# Patient Record
Sex: Female | Born: 1975
Health system: Southern US, Community
[De-identification: ages and names within clinical notes are randomized; demographics above are authoritative.]

## PROBLEM LIST (undated history)

## (undated) DIAGNOSIS — K219 Gastro-esophageal reflux disease without esophagitis: Secondary | ICD-10-CM

## (undated) DIAGNOSIS — N912 Amenorrhea, unspecified: Secondary | ICD-10-CM

## (undated) DIAGNOSIS — K8020 Calculus of gallbladder without cholecystitis without obstruction: Secondary | ICD-10-CM

## (undated) DIAGNOSIS — G43909 Migraine, unspecified, not intractable, without status migrainosus: Secondary | ICD-10-CM

## (undated) DIAGNOSIS — N83209 Unspecified ovarian cyst, unspecified side: Secondary | ICD-10-CM

## (undated) HISTORY — DX: Gastro-esophageal reflux disease without esophagitis: K21.9

## (undated) HISTORY — DX: Calculus of gallbladder without cholecystitis without obstruction: K80.20

## (undated) HISTORY — DX: Migraine, unspecified, not intractable, without status migrainosus: G43.909

## (undated) HISTORY — DX: Unspecified ovarian cyst, unspecified side: N83.209

## (undated) HISTORY — DX: Amenorrhea, unspecified: N91.2

---

## 2008-03-28 ENCOUNTER — Ambulatory Visit: Payer: Self-pay

## 2008-07-12 ENCOUNTER — Encounter: Payer: Self-pay | Admitting: General Practice

## 2008-07-27 ENCOUNTER — Encounter: Payer: Self-pay | Admitting: General Practice

## 2008-08-27 ENCOUNTER — Encounter: Payer: Self-pay | Admitting: General Practice

## 2010-11-30 ENCOUNTER — Emergency Department: Payer: Self-pay | Admitting: Emergency Medicine

## 2011-08-13 ENCOUNTER — Emergency Department: Payer: Self-pay | Admitting: Emergency Medicine

## 2011-08-13 ENCOUNTER — Ambulatory Visit: Payer: Self-pay | Admitting: Family Medicine

## 2011-08-13 LAB — URINALYSIS, COMPLETE
Bilirubin,UR: NEGATIVE
Glucose,UR: 150 mg/dL (ref 0–75)
Leukocyte Esterase: NEGATIVE
Nitrite: NEGATIVE
Ph: 6 (ref 4.5–8.0)
Squamous Epithelial: 2

## 2011-08-13 LAB — COMPREHENSIVE METABOLIC PANEL
Albumin: 3.1 g/dL — ABNORMAL LOW (ref 3.4–5.0)
Alkaline Phosphatase: 176 U/L — ABNORMAL HIGH (ref 50–136)
Anion Gap: 11 (ref 7–16)
BUN: 6 mg/dL — ABNORMAL LOW (ref 7–18)
Bilirubin,Total: 0.4 mg/dL (ref 0.2–1.0)
Calcium, Total: 8.9 mg/dL (ref 8.5–10.1)
Chloride: 103 mmol/L (ref 98–107)
Co2: 21 mmol/L (ref 21–32)
EGFR (African American): >60
Osmolality: 271 (ref 275–301)
Potassium: 3.6 mmol/L (ref 3.5–5.1)
SGOT(AST): 54 U/L — ABNORMAL HIGH (ref 15–37)
SGPT (ALT): 75 U/L
Total Protein: 7 g/dL (ref 6.4–8.2)

## 2011-08-13 LAB — LIPASE, BLOOD: Lipase: 152 U/L (ref 73–393)

## 2011-08-13 LAB — CBC
MCV: 84 fL (ref 80–100)
Platelet: 174 10*3/uL (ref 150–440)
RDW: 13.8 % (ref 11.5–14.5)

## 2011-10-09 ENCOUNTER — Observation Stay: Payer: Self-pay

## 2011-10-16 ENCOUNTER — Observation Stay: Payer: Self-pay

## 2011-10-19 ENCOUNTER — Observation Stay: Payer: Self-pay

## 2011-10-23 ENCOUNTER — Inpatient Hospital Stay: Payer: Self-pay | Admitting: Advanced Practice Midwife

## 2011-10-23 LAB — PROTEIN / CREATININE RATIO, URINE
Protein, Random Urine: 28 mg/dL — ABNORMAL HIGH (ref 0–12)
Protein/Creat. Ratio: 613 mg/gCREAT — ABNORMAL HIGH (ref 0–200)

## 2011-10-23 LAB — CBC WITH DIFFERENTIAL/PLATELET
Basophil #: 0.1 10*3/uL (ref 0.0–0.1)
Basophil %: 0.6 %
Eosinophil #: 0 10*3/uL (ref 0.0–0.7)
HCT: 32.8 % — ABNORMAL LOW (ref 35.0–47.0)
Lymphocyte %: 13.2 %
MCHC: 34.7 g/dL (ref 32.0–36.0)
Monocyte #: 0.8 x10 3/mm (ref 0.2–0.9)
Monocyte %: 6.4 %
Neutrophil %: 79.4 %
Platelet: 167 10*3/uL (ref 150–440)
RBC: 4.01 10*6/uL (ref 3.80–5.20)
RDW: 14.7 % — ABNORMAL HIGH (ref 11.5–14.5)

## 2011-10-24 LAB — PLATELET COUNT: Platelet: 171 10*3/uL (ref 150–440)

## 2011-10-25 LAB — HEMATOCRIT: HCT: 31.3 % — ABNORMAL LOW (ref 35.0–47.0)

## 2011-10-29 LAB — PATHOLOGY REPORT

## 2012-01-28 HISTORY — PX: CHOLECYSTECTOMY: SHX55

## 2012-08-09 ENCOUNTER — Other Ambulatory Visit: Payer: Self-pay | Admitting: Surgery

## 2012-08-09 LAB — COMPREHENSIVE METABOLIC PANEL
Albumin: 3.7 g/dL (ref 3.4–5.0)
Alkaline Phosphatase: 83 U/L (ref 50–136)
Anion Gap: 2 — ABNORMAL LOW (ref 7–16)
Chloride: 105 mmol/L (ref 98–107)
Co2: 31 mmol/L (ref 21–32)
Creatinine: 0.76 mg/dL (ref 0.60–1.30)
EGFR (African American): >60
Glucose: 89 mg/dL (ref 65–99)
Osmolality: 275 (ref 275–301)
Potassium: 3.9 mmol/L (ref 3.5–5.1)
SGPT (ALT): 27 U/L (ref 12–78)
Total Protein: 7.4 g/dL (ref 6.4–8.2)

## 2012-08-09 LAB — CBC WITH DIFFERENTIAL/PLATELET
Basophil %: 0.5 %
Eosinophil #: 0.1 10*3/uL (ref 0.0–0.7)
Lymphocyte %: 24.1 %
MCHC: 33.6 g/dL (ref 32.0–36.0)
MCV: 81 fL (ref 80–100)
Monocyte #: 0.4 x10 3/mm (ref 0.2–0.9)
Monocyte %: 7.2 %
Neutrophil #: 4 10*3/uL (ref 1.4–6.5)
Neutrophil %: 67 %
Platelet: 211 10*3/uL (ref 150–440)
RBC: 4.55 10*6/uL (ref 3.80–5.20)
RDW: 14.4 % (ref 11.5–14.5)
WBC: 6 10*3/uL (ref 3.6–11.0)

## 2012-08-10 ENCOUNTER — Ambulatory Visit: Payer: Self-pay | Admitting: Surgery

## 2012-10-12 ENCOUNTER — Ambulatory Visit: Payer: Self-pay | Admitting: Surgery

## 2012-10-12 ENCOUNTER — Observation Stay: Payer: Self-pay | Admitting: Surgery

## 2012-10-13 LAB — CBC WITH DIFFERENTIAL/PLATELET
Basophil #: 0.1 10*3/uL (ref 0.0–0.1)
Eosinophil %: 0.1 %
HCT: 37.1 % (ref 35.0–47.0)
HGB: 12.5 g/dL (ref 12.0–16.0)
Lymphocyte #: 1.3 10*3/uL (ref 1.0–3.6)
MCHC: 33.5 g/dL (ref 32.0–36.0)
MCV: 81 fL (ref 80–100)
Neutrophil #: 9.2 10*3/uL — ABNORMAL HIGH (ref 1.4–6.5)
Neutrophil %: 81.3 %
RBC: 4.6 10*6/uL (ref 3.80–5.20)

## 2012-10-13 LAB — LIPASE, BLOOD: Lipase: 49 U/L — ABNORMAL LOW (ref 73–393)

## 2012-10-13 LAB — BASIC METABOLIC PANEL
Anion Gap: 4 — ABNORMAL LOW (ref 7–16)
BUN: 8 mg/dL (ref 7–18)
Chloride: 106 mmol/L (ref 98–107)
Co2: 27 mmol/L (ref 21–32)
Creatinine: 0.77 mg/dL (ref 0.60–1.30)
EGFR (African American): >60
EGFR (Non-African Amer.): >60
Glucose: 102 mg/dL — ABNORMAL HIGH (ref 65–99)
Osmolality: 272 (ref 275–301)
Potassium: 4.3 mmol/L (ref 3.5–5.1)
Sodium: 137 mmol/L (ref 136–145)

## 2012-10-13 LAB — HEPATIC FUNCTION PANEL A (ARMC)
Albumin: 3.6 g/dL (ref 3.4–5.0)
Alkaline Phosphatase: 72 U/L (ref 50–136)
Bilirubin, Direct: <0.1 mg/dL (ref 0.00–0.20)
SGOT(AST): 37 U/L (ref 15–37)

## 2014-05-03 ENCOUNTER — Encounter: Payer: Self-pay | Admitting: *Deleted

## 2014-05-19 NOTE — Discharge Summary (Signed)
PATIENT NAME:  Valerie Lindsey, Valerie Lindsey MR#:  [id number] DATE OF BIRTH:  [birth year]  DATE OF ADMISSION:  10/12/2012 DATE OF DISCHARGE:  10/13/2012  BRIEF HISTORY: Afua Hoots is a 39 year old woman readmitted following a laparoscopic cholecystectomy for nausea and vomiting. She had persistent problems with nausea and vomiting postsurgery to the point that she was unable to keep anything on her stomach. She presented back to the Emergency Room for further evaluation. Exam was unremarkable. She was readmitted to the hospital for her persistent vomiting. She was rehydrated, given IV Zofran and her symptoms resolved over the first 8 to 12 hours. She has been 12 hours without vomiting, able to tolerate a liquid diet and has no more abdominal distress. She does have some mild pain which is well controlled with Norco.   DISCHARGE MEDICATIONS: Include Nexium 40 mg p.o. daily, Norco 325/5 mg once a day and Zofran 4 mg dissolving tablets q.8 hours p.r.n.   FINAL DISCHARGE DIAGNOSIS: Postoperative nausea and vomiting.    ____________________________ Micheline Maze, MD rle:gb D: 10/13/2012 19:34:10 ET T: 10/14/2012 01:45:51 ET JOB#: [id number]  cc: Rodena Goldmann III, MD, <Dictator> Rodena Goldmann MD ELECTRONICALLY SIGNED 10/18/2012 20:04

## 2014-05-19 NOTE — Op Note (Signed)
PATIENT NAME:  Valerie Lindsey, Valerie Lindsey MR#:  [id number] DATE OF BIRTH:  05-13-1975  DATE OF PROCEDURE:  10/12/2012  PREOPERATIVE DIAGNOSIS: Cholecystis and cholelithiasis.   POSTOPERATIVE DIAGNOSIS: Cholecystis and cholelithiasis.   OPERATION: Laparoscopic cholecystectomy with cholangiography.   SURGEON: Rodena Goldmann III, MD  ANESTHESIA: General.   OPERATIVE PROCEDURE: With the patient in the supine position, after the induction of the appropriate general anesthesia, the patient's abdomen was prepped with ChloraPrep and draped with sterile towels. The patient was placed in the head down, feet up position. A small infraumbilical incision was made in the standard fashion, carried down bluntly through the subcutaneous tissue. The Veress needle was used to cannulate the peritoneal cavity. CO2 was insufflated to appropriate pressure measurements. When approximately 2.5 liters of CO2 was instilled, the Veress needle was withdrawn. An 11 mm [employer] port was inserted into the peritoneal cavity. Intraperitoneal position was confirmed, and CO2 was re-insufflated. The patient was placed in the head up, feet down position and rotated slightly to the left side. A subxiphoid transverse incision was made, and an 11 mm port was inserted under direct vision. Two lateral ports, 5 mm in size, were inserted under direct vision. Dissection was carried out along the hepatoduodenal ligament. The cystic artery and cystic duct were identified. The cystic duct was quite short. The common duct was easily visualized. The cystic duct was clipped on the gallbladder side and opened. intraoperative cholangiogram was attempted, but I could not get the cholangiogram catheter to go past the valves, and because the duct was so short, I did not want to make a second incision. We therefore injected contrast into the common bile duct through the cystic duct, took a single picture, which did demonstrate flow into the duodenum without evidence  of any obstruction. Intrahepatic radicles were not seen, but the common bile duct was easily visualized. The catheter was withdrawn. The cystic duct was doubly clipped on the common duct side. The cystic duct was then divided. The cystic artery was doubly clipped and divided. The gallbladder was then dissected free from its bed in the liver with hook and cautery apparatus. Once the gallbladder was free, the gallbladder was captured in an Endo Catch apparatus and removed through the subxiphoid incision. The area was then copiously irrigated. Pictures were taken. The upper incision was closed with a figure-of-eight suture of 0 Vicryl under direct vision using the suture passer apparatus. The abdomen was then desufflated. All ports were withdrawn without difficulty. The midline incision was closed with 5-0 nylon suture. The remainder of the incisions were closed with subcuticular 4-0 Monocryl. Dermabond was placed. Sterile dressings were placed after injecting 0.25% Marcaine for postoperative pain control. The patient was returned to the recovery room, having tolerated the procedure well. Sponge, instrument and needle counts were correct x2 in the operating room.   ____________________________ Rodena Goldmann III, MD rle:OSi D: 10/12/2012 08:33:58 ET T: 10/12/2012 09:14:50 ET JOB#: [id number]  cc: Rodena Goldmann III, MD, <Dictator> Rodena Goldmann MD ELECTRONICALLY SIGNED 10/14/2012 0:05

## 2015-03-15 DIAGNOSIS — G43829 Menstrual migraine, not intractable, without status migrainosus: Secondary | ICD-10-CM | POA: Diagnosis not present

## 2015-03-15 DIAGNOSIS — Z01419 Encounter for gynecological examination (general) (routine) without abnormal findings: Secondary | ICD-10-CM | POA: Diagnosis not present

## 2015-03-15 DIAGNOSIS — K219 Gastro-esophageal reflux disease without esophagitis: Secondary | ICD-10-CM | POA: Diagnosis not present

## 2015-04-18 DIAGNOSIS — H52223 Regular astigmatism, bilateral: Secondary | ICD-10-CM | POA: Diagnosis not present

## 2015-04-18 DIAGNOSIS — H5213 Myopia, bilateral: Secondary | ICD-10-CM | POA: Diagnosis not present

## 2015-06-08 ENCOUNTER — Encounter: Payer: Self-pay | Admitting: Family Medicine

## 2015-06-08 ENCOUNTER — Ambulatory Visit (INDEPENDENT_AMBULATORY_CARE_PROVIDER_SITE_OTHER): Payer: 59 | Admitting: Family Medicine

## 2015-06-08 VITALS — BP 118/64 | HR 61 | Temp 98.1°F | Ht 65.75 in | Wt 197.4 lb

## 2015-06-08 DIAGNOSIS — Z13220 Encounter for screening for lipoid disorders: Secondary | ICD-10-CM

## 2015-06-08 DIAGNOSIS — E669 Obesity, unspecified: Secondary | ICD-10-CM

## 2015-06-08 DIAGNOSIS — Z130 Encounter for screening for diseases of the blood and blood-forming organs and certain disorders involving the immune mechanism: Secondary | ICD-10-CM

## 2015-06-08 DIAGNOSIS — Z0000 Encounter for general adult medical examination without abnormal findings: Secondary | ICD-10-CM

## 2015-06-08 LAB — COMPREHENSIVE METABOLIC PANEL
ALBUMIN: 4.2 g/dL (ref 3.6–5.1)
ALK PHOS: 60 U/L (ref 33–115)
ALT: 21 U/L (ref 6–29)
AST: 18 U/L (ref 10–30)
BILIRUBIN TOTAL: 0.3 mg/dL (ref 0.2–1.2)
BUN: 13 mg/dL (ref 7–25)
CALCIUM: 9.5 mg/dL (ref 8.6–10.2)
CO2: 26 mmol/L (ref 20–31)
CREATININE: 0.7 mg/dL (ref 0.50–1.10)
Chloride: 105 mmol/L (ref 98–110)
Glucose, Bld: 89 mg/dL (ref 65–99)
Potassium: 3.8 mmol/L (ref 3.5–5.3)
Sodium: 139 mmol/L (ref 135–146)
TOTAL PROTEIN: 6.8 g/dL (ref 6.1–8.1)

## 2015-06-08 LAB — CBC
HEMATOCRIT: 36.2 % (ref 35.0–45.0)
Hemoglobin: 12.3 g/dL (ref 11.7–15.5)
MCH: 27.3 pg (ref 27.0–33.0)
MCHC: 34 g/dL (ref 32.0–36.0)
MCV: 80.3 fL (ref 80.0–100.0)
MPV: 9.3 fL (ref 7.5–12.5)
Platelets: 219 10*3/uL (ref 140–400)
RBC: 4.51 MIL/uL (ref 3.80–5.10)
RDW: 14.7 % (ref 11.0–15.0)
WBC: 7.3 10*3/uL (ref 3.8–10.8)

## 2015-06-08 LAB — LIPID PANEL
CHOLESTEROL: 181 mg/dL (ref 125–200)
HDL: 52 mg/dL (ref >=46)
LDL Cholesterol: 100 mg/dL (ref <130)
Total CHOL/HDL Ratio: 3.5 Ratio (ref <=5.0)
Triglycerides: 145 mg/dL (ref <150)
VLDL: 29 mg/dL (ref <30)

## 2015-06-08 LAB — TSH: TSH: 0.51 mIU/L

## 2015-06-08 MED ORDER — OMEPRAZOLE 40 MG PO CPDR: 40.0000 mg | DELAYED_RELEASE_CAPSULE | Freq: Every day | ORAL | Status: DC

## 2015-06-08 MED ORDER — SUMATRIPTAN 20 MG/ACT NA SOLN: 20.0000 mg | Freq: Once | NASAL | Status: DC

## 2015-06-08 NOTE — Progress Notes (Signed)
Pre visit review using our clinic review tool, if applicable. No additional management support is needed unless otherwise documented below in the visit note. 

## 2015-06-08 NOTE — Patient Instructions (Signed)
It was nice to see you today.  I have reviewed her medications.  Please be sure to get her mammogram later this year.  Follow up annually.  Take care  Dr. Adriana Simas  Health Maintenance, Female Adopting a healthy lifestyle and getting preventive care can go a long way to promote health and wellness. Talk with your health care provider about what schedule of regular examinations is right for you. This is a good chance for you to check in with your provider about disease prevention and staying healthy. In between checkups, there are plenty of things you can do on your own. Experts have done a lot of research about which lifestyle changes and preventive measures are most likely to keep you healthy. Ask your health care provider for more information. WEIGHT AND DIET  Eat a healthy diet  Be sure to include plenty of vegetables, fruits, low-fat dairy products, and lean protein.  Do not eat a lot of foods high in solid fats, added sugars, or salt.  Get regular exercise. This is one of the most important things you can do for your health.  Most adults should exercise for at least 150 minutes each week. The exercise should increase your heart rate and make you sweat (moderate-intensity exercise).  Most adults should also do strengthening exercises at least twice a week. This is in addition to the moderate-intensity exercise.  Maintain a healthy weight  Body mass index (BMI) is a measurement that can be used to identify possible weight problems. It estimates body fat based on height and weight. Your health care provider can help determine your BMI and help you achieve or maintain a healthy weight.  For females 55 years of age and older:   A BMI below 18.5 is considered underweight.  A BMI of 18.5 to 24.9 is normal.  A BMI of 25 to 29.9 is considered overweight.  A BMI of 30 and above is considered obese.  Watch levels of cholesterol and blood lipids  You should start having your blood  tested for lipids and cholesterol at 40 years of age, then have this test every 5 years.  You may need to have your cholesterol levels checked more often if:  Your lipid or cholesterol levels are high.  You are older than 40 years of age.  You are at high risk for heart disease.  CANCER SCREENING   Lung Cancer  Lung cancer screening is recommended for adults 35-24 years old who are at high risk for lung cancer because of a history of smoking.  A yearly low-dose CT scan of the lungs is recommended for people who:  Currently smoke.  Have quit within the past 15 years.  Have at least a 30-pack-year history of smoking. A pack year is smoking an average of one pack of cigarettes a day for 1 year.  Yearly screening should continue until it has been 15 years since you quit.  Yearly screening should stop if you develop a health problem that would prevent you from having lung cancer treatment.  Breast Cancer  Practice breast self-awareness. This means understanding how your breasts normally appear and feel.  It also means doing regular breast self-exams. Let your health care provider know about any changes, no matter how small.  If you are in your 20s or 30s, you should have a clinical breast exam (CBE) by a health care provider every 1-3 years as part of a regular health exam.  If you are 40 or older, have  a CBE every year. Also consider having a breast X-ray (mammogram) every year.  If you have a family history of breast cancer, talk to your health care provider about genetic screening.  If you are at high risk for breast cancer, talk to your health care provider about having an MRI and a mammogram every year.  Breast cancer gene (BRCA) assessment is recommended for women who have family members with BRCA-related cancers. BRCA-related cancers include:  Breast.  Ovarian.  Tubal.  Peritoneal cancers.  Results of the assessment will determine the need for genetic counseling  and BRCA1 and BRCA2 testing. Cervical Cancer Your health care provider may recommend that you be screened regularly for cancer of the pelvic organs (ovaries, uterus, and vagina). This screening involves a pelvic examination, including checking for microscopic changes to the surface of your cervix (Pap test). You may be encouraged to have this screening done every 3 years, beginning at age 21.  For women ages 30-65, health care providers may recommend pelvic exams and Pap testing every 3 years, or they may recommend the Pap and pelvic exam, combined with testing for human papilloma virus (HPV), every 5 years. Some types of HPV increase your risk of cervical cancer. Testing for HPV may also be done on women of any age with unclear Pap test results.  Other health care providers may not recommend any screening for nonpregnant women who are considered low risk for pelvic cancer and who do not have symptoms. Ask your health care provider if a screening pelvic exam is right for you.  If you have had past treatment for cervical cancer or a condition that could lead to cancer, you need Pap tests and screening for cancer for at least 20 years after your treatment. If Pap tests have been discontinued, your risk factors (such as having a new sexual partner) need to be reassessed to determine if screening should resume. Some women have medical problems that increase the chance of getting cervical cancer. In these cases, your health care provider may recommend more frequent screening and Pap tests. Colorectal Cancer  This type of cancer can be detected and often prevented.  Routine colorectal cancer screening usually begins at 40 years of age and continues through 40 years of age.  Your health care provider may recommend screening at an earlier age if you have risk factors for colon cancer.  Your health care provider may also recommend using home test kits to check for hidden blood in the stool.  A small camera  at the end of a tube can be used to examine your colon directly (sigmoidoscopy or colonoscopy). This is done to check for the earliest forms of colorectal cancer.  Routine screening usually begins at age 50.  Direct examination of the colon should be repeated every 5-10 years through 40 years of age. However, you may need to be screened more often if early forms of precancerous polyps or small growths are found. Skin Cancer  Check your skin from head to toe regularly.  Tell your health care provider about any new moles or changes in moles, especially if there is a change in a mole's shape or color.  Also tell your health care provider if you have a mole that is larger than the size of a pencil eraser.  Always use sunscreen. Apply sunscreen liberally and repeatedly throughout the day.  Protect yourself by wearing long sleeves, pants, a wide-brimmed hat, and sunglasses whenever you are outside. HEART DISEASE, DIABETES, AND HIGH   BLOOD PRESSURE   High blood pressure causes heart disease and increases the risk of stroke. High blood pressure is more likely to develop in:  People who have blood pressure in the high end of the normal range (130-139/85-89 mm Hg).  People who are overweight or obese.  People who are African American.  If you are 18-39 years of age, have your blood pressure checked every 3-5 years. If you are 40 years of age or older, have your blood pressure checked every year. You should have your blood pressure measured twice--once when you are at a hospital or clinic, and once when you are not at a hospital or clinic. Record the average of the two measurements. To check your blood pressure when you are not at a hospital or clinic, you can use:  An automated blood pressure machine at a pharmacy.  A home blood pressure monitor.  If you are between 55 years and 79 years old, ask your health care provider if you should take aspirin to prevent strokes.  Have regular diabetes  screenings. This involves taking a blood sample to check your fasting blood sugar level.  If you are at a normal weight and have a low risk for diabetes, have this test once every three years after 40 years of age.  If you are overweight and have a high risk for diabetes, consider being tested at a younger age or more often. PREVENTING INFECTION  Hepatitis B  If you have a higher risk for hepatitis B, you should be screened for this virus. You are considered at high risk for hepatitis B if:  You were born in a country where hepatitis B is common. Ask your health care provider which countries are considered high risk.  Your parents were born in a high-risk country, and you have not been immunized against hepatitis B (hepatitis B vaccine).  You have HIV or AIDS.  You use needles to inject street drugs.  You live with someone who has hepatitis B.  You have had sex with someone who has hepatitis B.  You get hemodialysis treatment.  You take certain medicines for conditions, including cancer, organ transplantation, and autoimmune conditions. Hepatitis C  Blood testing is recommended for:  Everyone born from 1945 through 1965.  Anyone with known risk factors for hepatitis C. Sexually transmitted infections (STIs)  You should be screened for sexually transmitted infections (STIs) including gonorrhea and chlamydia if:  You are sexually active and are younger than 40 years of age.  You are older than 40 years of age and your health care provider tells you that you are at risk for this type of infection.  Your sexual activity has changed since you were last screened and you are at an increased risk for chlamydia or gonorrhea. Ask your health care provider if you are at risk.  If you do not have HIV, but are at risk, it may be recommended that you take a prescription medicine daily to prevent HIV infection. This is called pre-exposure prophylaxis (PrEP). You are considered at risk  if:  You are sexually active and do not regularly use condoms or know the HIV status of your partner(s).  You take drugs by injection.  You are sexually active with a partner who has HIV. Talk with your health care provider about whether you are at high risk of being infected with HIV. If you choose to begin PrEP, you should first be tested for HIV. You should then be tested every   3 months for as long as you are taking PrEP.  PREGNANCY   If you are premenopausal and you may become pregnant, ask your health care provider about preconception counseling.  If you may become pregnant, take 400 to 800 micrograms (mcg) of folic acid every day.  If you want to prevent pregnancy, talk to your health care provider about birth control (contraception). OSTEOPOROSIS AND MENOPAUSE   Osteoporosis is a disease in which the bones lose minerals and strength with aging. This can result in serious bone fractures. Your risk for osteoporosis can be identified using a bone density scan.  If you are 55 years of age or older, or if you are at risk for osteoporosis and fractures, ask your health care provider if you should be screened.  Ask your health care provider whether you should take a calcium or vitamin D supplement to lower your risk for osteoporosis.  Menopause may have certain physical symptoms and risks.  Hormone replacement therapy may reduce some of these symptoms and risks. Talk to your health care provider about whether hormone replacement therapy is right for you.  HOME CARE INSTRUCTIONS   Schedule regular health, dental, and eye exams.  Stay current with your immunizations.   Do not use any tobacco products including cigarettes, chewing tobacco, or electronic cigarettes.  If you are pregnant, do not drink alcohol.  If you are breastfeeding, limit how much and how often you drink alcohol.  Limit alcohol intake to no more than 1 drink per day for nonpregnant women. One drink equals 12  ounces of beer, 5 ounces of wine, or 1 ounces of hard liquor.  Do not use street drugs.  Do not share needles.  Ask your health care provider for help if you need support or information about quitting drugs.  Tell your health care provider if you often feel depressed.  Tell your health care provider if you have ever been abused or do not feel safe at home.   This information is not intended to replace advice given to you by your health care provider. Make sure you discuss any questions you have with your health care provider.   Document Released: 07/29/2010 Document Revised: 02/03/2014 Document Reviewed: 12/15/2012 Elsevier Interactive Patient Education [employer].

## 2015-06-09 LAB — HEMOGLOBIN A1C
Hgb A1c MFr Bld: 5.4 % (ref <5.7)
Mean Plasma Glucose: 108 mg/dL

## 2015-06-11 ENCOUNTER — Encounter: Payer: Self-pay | Admitting: Family Medicine

## 2015-06-11 DIAGNOSIS — Z0000 Encounter for general adult medical examination without abnormal findings: Secondary | ICD-10-CM | POA: Insufficient documentation

## 2015-06-11 DIAGNOSIS — G43909 Migraine, unspecified, not intractable, without status migrainosus: Secondary | ICD-10-CM | POA: Insufficient documentation

## 2015-06-11 DIAGNOSIS — Z0001 Encounter for general adult medical examination with abnormal findings: Secondary | ICD-10-CM | POA: Insufficient documentation

## 2015-06-11 NOTE — Assessment & Plan Note (Signed)
Immunizations up to date. Screening labs today.  Tetanus up to date. Pap smear up to date. Discussed mammogram later this year.

## 2015-06-11 NOTE — Progress Notes (Signed)
Subjective:  Patient ID: Valerie Lindsey, female    DOB: 01-Jul-1975  Age: 40 y.o. MRN: [id number]  CC: Establish care  HPI Valerie Lindsey is a 40 y.o. female presents to the clinic today to establish care.  Preventative Healthcare  Pap smear: 02/2014.  Mammogram: Not yet needed.  Colonoscopy: N/A.  Immunizations  Tetanus - 2014.  Pneumococcal - N/A.  Flu - Up to date.  Zoster - N/A.  Hepatitis C screening - N/A.  Labs: Screening labs today.  Exercise: No regular exercise.   Alcohol use: No.  Smoking/tobacco use: No.  Regular dental exams: Yes.  Wears seat belt: Yes.  PMH, Surgical Hx, Family Hx, Social History reviewed and updated as below.  Past Medical History  Diagnosis Date  . History of arthritis     hands and knees  . History of headache     Migraine headaches  . GERD (gastroesophageal reflux disease)   . History of hyperlipidemia    Past Surgical History  Procedure Laterality Date  . Cholecystectomy  2014   Family History  Problem Relation Age of Onset  . Prostate cancer Father   . Hyperlipidemia Mother   . Heart disease Mother   . Heart disease Father   . Stroke Paternal Grandmother   . Hypertension Mother    Social History  Substance Use Topics  . Smoking status: Never Smoker   . Smokeless tobacco: Never Used  . Alcohol Use: No   Review of Systems  Cardiovascular:       Recent chest pain; current resolved.  Skin:       Moles on back.  Neurological: Positive for headaches.  All other systems reviewed and are negative.  Objective:   Today's Vitals: BP 118/64 mmHg  Pulse 61  Temp(Src) 98.1 F (36.7 C) (Oral)  Ht 5' 5.75" (1.67 m)  Wt 197 lb 6 oz (89.529 kg)  BMI 32.10 kg/m2  SpO2 97%  LMP 06/04/2015  Physical Exam  Constitutional: She is oriented to person, place, and time. She appears well-developed and well-nourished. No distress.  HENT:  Head: Normocephalic and atraumatic.  Nose: Nose normal.  Mouth/Throat:  Oropharynx is clear and moist. No oropharyngeal exudate.  Normal TM's bilaterally.   Eyes: Conjunctivae are normal. No scleral icterus.  Neck: Neck supple. No thyromegaly present.  Cardiovascular: Normal rate and regular rhythm.   No murmur heard. Pulmonary/Chest: Effort normal and breath sounds normal. She has no wheezes. She has no rales.  Abdominal: Soft. She exhibits no distension. There is no tenderness. There is no rebound and no guarding.  Musculoskeletal: Normal range of motion. She exhibits no edema.  Lymphadenopathy:    She has no cervical adenopathy.  Neurological: She is alert and oriented to person, place, and time.  Skin: Skin is warm and dry. No rash noted.  Psychiatric: She has a normal mood and affect.  Vitals reviewed.  Assessment & Plan:   Problem List Items Addressed This Visit    Preventative health care - Primary    Immunizations up to date. Screening labs today.  Tetanus up to date. Pap smear up to date. Discussed mammogram later this year.        Other Visit Diagnoses    Screening for deficiency anemia        Relevant Orders    CBC (Completed)    Obesity (BMI 30.0-34.9)        Relevant Orders    Comp Met (CMET) (Completed)    HgB  A1c (Completed)    TSH (Completed)    Screening, lipid        Relevant Orders    Lipid Profile (Completed)       Outpatient Encounter Prescriptions as of 06/08/2015  Medication Sig  . omeprazole (PRILOSEC) 40 MG capsule Take 1 capsule (40 mg total) by mouth daily.  . SUMAtriptan (IMITREX) 50 MG tablet Take 1 tablet by mouth as needed.  . [DISCONTINUED] omeprazole (PRILOSEC) 40 MG capsule Take 1 capsule by mouth daily.  . SUMAtriptan (IMITREX) 20 MG/ACT nasal spray Place 1 spray (20 mg total) into the nose once. May repeat in 2 hours x 1 if needed.   No facility-administered encounter medications on file as of 06/08/2015.    Follow-up: Return in about 1 year (around 06/07/2016), or if symptoms worsen or fail to  improve.  [place]

## 2015-10-29 ENCOUNTER — Encounter: Payer: Self-pay | Admitting: Family Medicine

## 2015-10-29 ENCOUNTER — Other Ambulatory Visit: Payer: Self-pay | Admitting: Family Medicine

## 2015-10-29 MED ORDER — OMEPRAZOLE 40 MG PO CPDR: 40.0000 mg | DELAYED_RELEASE_CAPSULE | Freq: Every day | ORAL | 3 refills | Status: DC

## 2016-06-09 ENCOUNTER — Ambulatory Visit (INDEPENDENT_AMBULATORY_CARE_PROVIDER_SITE_OTHER): Payer: 59 | Admitting: Family Medicine

## 2016-06-09 ENCOUNTER — Encounter: Payer: Self-pay | Admitting: Family Medicine

## 2016-06-09 VITALS — BP 128/86 | HR 79 | Temp 98.4°F | Ht 66.0 in | Wt 203.1 lb

## 2016-06-09 DIAGNOSIS — Z0000 Encounter for general adult medical examination without abnormal findings: Secondary | ICD-10-CM

## 2016-06-09 DIAGNOSIS — Z1231 Encounter for screening mammogram for malignant neoplasm of breast: Secondary | ICD-10-CM | POA: Diagnosis not present

## 2016-06-09 DIAGNOSIS — Z1239 Encounter for other screening for malignant neoplasm of breast: Secondary | ICD-10-CM

## 2016-06-09 MED ORDER — SUMATRIPTAN SUCCINATE 100 MG PO TABS: ORAL_TABLET | ORAL | 6 refills | Status: DC

## 2016-06-09 NOTE — Assessment & Plan Note (Signed)
Pap smear up-to-date. Needs mammogram. Immunizations up-to-date. Labs today. Discussed weight loss and dietary changes.

## 2016-06-09 NOTE — Progress Notes (Signed)
   Subjective:  Patient ID: Valerie Lindsey, female    DOB: 1975/06/05  Age: 41 y.o. MRN: [id number]  CC: Annual exam  HPI Valerie Lindsey is a 41 y.o. female presents to the clinic today for an annual exam.  Preventative Healthcare  Pap smear: Up to date.   Mammogram: In need of.  Colonoscopy: N/A.  Immunizations - Up to date.   Labs: Labs today.  Exercise: No regular exercise.   Alcohol use: No.  Smoking/tobacco use: No.  STD/HIV testing: Declines.   PMH, Surgical Hx, Family Hx, Social History reviewed and updated as below.  Past Medical History:  Diagnosis Date  . GERD (gastroesophageal reflux disease)   . Migraine    Past Surgical History:  Procedure Laterality Date  . CHOLECYSTECTOMY  2014   Family History  Problem Relation Age of Onset  . Prostate cancer Father   . Hyperlipidemia Mother   . Heart disease Mother   . Heart disease Father   . Stroke Paternal Grandmother   . Hypertension Mother    Social History  Substance Use Topics  . Smoking status: Never Smoker  . Smokeless tobacco: Never Used  . Alcohol use No   Review of Systems  Neurological: Positive for headaches.  All other systems reviewed and are negative.  Objective:   Today's Vitals: BP 128/86   Pulse 79   Temp 98.4 F (36.9 C) (Oral)   Ht 5\' 6"  (1.676 m)   Wt 203 lb 2 oz (92.1 kg)   SpO2 98%   BMI 32.79 kg/m   Physical Exam  Constitutional: She is oriented to person, place, and time. She appears well-developed and well-nourished. No distress.  HENT:  Head: Normocephalic and atraumatic.  Nose: Nose normal.  Mouth/Throat: Oropharynx is clear and moist. No oropharyngeal exudate.  Normal TM's bilaterally.   Eyes: Conjunctivae are normal. No scleral icterus.  Neck: Neck supple.  Cardiovascular: Normal rate and regular rhythm.   No murmur heard. Pulmonary/Chest: Effort normal and breath sounds normal. She has no wheezes. She has no rales.  Abdominal: Soft. She exhibits no  distension. There is no tenderness. There is no rebound and no guarding.  Musculoskeletal: Normal range of motion. She exhibits no edema.  Lymphadenopathy:    She has no cervical adenopathy.  Neurological: She is alert and oriented to person, place, and time.  Skin: Skin is warm and dry. No rash noted.  Psychiatric: She has a normal mood and affect.  Vitals reviewed.  Assessment & Plan:   Problem List Items Addressed This Visit    Annual physical exam - Primary    Pap smear up-to-date. Needs mammogram. Immunizations up-to-date. Labs today. Discussed weight loss and dietary changes.      Relevant Orders   CBC   Comprehensive metabolic panel   Lipid panel   TSH      Meds ordered this encounter  Medications  . SUMAtriptan (IMITREX) 100 MG tablet    Sig: 1 tablet at start of migraine. May have second dose in 2 hours.    Dispense:  30 tablet    Refill:  6   Follow-up: Annually  [place]

## 2016-06-09 NOTE — Patient Instructions (Signed)
Health Maintenance, Female Adopting a healthy lifestyle and getting preventive care can go a long way to promote health and wellness. Talk with your health care provider about what schedule of regular examinations is right for you. This is a good chance for you to check in with your provider about disease prevention and staying healthy. In between checkups, there are plenty of things you can do on your own. Experts have done a lot of research about which lifestyle changes and preventive measures are most likely to keep you healthy. Ask your health care provider for more information. Weight and diet Eat a healthy diet  Be sure to include plenty of vegetables, fruits, low-fat dairy products, and lean protein.  Do not eat a lot of foods high in solid fats, added sugars, or salt.  Get regular exercise. This is one of the most important things you can do for your health.  Most adults should exercise for at least 150 minutes each week. The exercise should increase your heart rate and make you sweat (moderate-intensity exercise).  Most adults should also do strengthening exercises at least twice a week. This is in addition to the moderate-intensity exercise. Maintain a healthy weight  Body mass index (BMI) is a measurement that can be used to identify possible weight problems. It estimates body fat based on height and weight. Your health care provider can help determine your BMI and help you achieve or maintain a healthy weight.  For females 76 years of age and older:  A BMI below 18.5 is considered underweight.  A BMI of 18.5 to 24.9 is normal.  A BMI of 25 to 29.9 is considered overweight.  A BMI of 30 and above is considered obese. Watch levels of cholesterol and blood lipids  You should start having your blood tested for lipids and cholesterol at 41 years of age, then have this test every 5 years.  You may need to have your cholesterol levels checked more often if:  Your lipid or  cholesterol levels are high.  You are older than 41 years of age.  You are at high risk for heart disease. Cancer screening Lung Cancer  Lung cancer screening is recommended for adults 64-42 years old who are at high risk for lung cancer because of a history of smoking.  A yearly low-dose CT scan of the lungs is recommended for people who:  Currently smoke.  Have quit within the past 15 years.  Have at least a 30-pack-year history of smoking. A pack year is smoking an average of one pack of cigarettes a day for 1 year.  Yearly screening should continue until it has been 15 years since you quit.  Yearly screening should stop if you develop a health problem that would prevent you from having lung cancer treatment. Breast Cancer  Practice breast self-awareness. This means understanding how your breasts normally appear and feel.  It also means doing regular breast self-exams. Let your health care provider know about any changes, no matter how small.  If you are in your 20s or 30s, you should have a clinical breast exam (CBE) by a health care provider every 1-3 years as part of a regular health exam.  If you are 34 or older, have a CBE every year. Also consider having a breast X-ray (mammogram) every year.  If you have a family history of breast cancer, talk to your health care provider about genetic screening.  If you are at high risk for breast cancer, talk  to your health care provider about having an MRI and a mammogram every year.  Breast cancer gene (BRCA) assessment is recommended for women who have family members with BRCA-related cancers. BRCA-related cancers include:  Breast.  Ovarian.  Tubal.  Peritoneal cancers.  Results of the assessment will determine the need for genetic counseling and BRCA1 and BRCA2 testing. Cervical Cancer  Your health care provider may recommend that you be screened regularly for cancer of the pelvic organs (ovaries, uterus, and vagina).  This screening involves a pelvic examination, including checking for microscopic changes to the surface of your cervix (Pap test). You may be encouraged to have this screening done every 3 years, beginning at age 24.  For women ages 66-65, health care providers may recommend pelvic exams and Pap testing every 3 years, or they may recommend the Pap and pelvic exam, combined with testing for human papilloma virus (HPV), every 5 years. Some types of HPV increase your risk of cervical cancer. Testing for HPV may also be done on women of any age with unclear Pap test results.  Other health care providers may not recommend any screening for nonpregnant women who are considered low risk for pelvic cancer and who do not have symptoms. Ask your health care provider if a screening pelvic exam is right for you.  If you have had past treatment for cervical cancer or a condition that could lead to cancer, you need Pap tests and screening for cancer for at least 20 years after your treatment. If Pap tests have been discontinued, your risk factors (such as having a new sexual partner) need to be reassessed to determine if screening should resume. Some women have medical problems that increase the chance of getting cervical cancer. In these cases, your health care provider may recommend more frequent screening and Pap tests. Colorectal Cancer  This type of cancer can be detected and often prevented.  Routine colorectal cancer screening usually begins at 41 years of age and continues through 41 years of age.  Your health care provider may recommend screening at an earlier age if you have risk factors for colon cancer.  Your health care provider may also recommend using home test kits to check for hidden blood in the stool.  A small camera at the end of a tube can be used to examine your colon directly (sigmoidoscopy or colonoscopy). This is done to check for the earliest forms of colorectal cancer.  Routine  screening usually begins at age 41.  Direct examination of the colon should be repeated every 5-10 years through 41 years of age. However, you may need to be screened more often if early forms of precancerous polyps or small growths are found. Skin Cancer  Check your skin from head to toe regularly.  Tell your health care provider about any new moles or changes in moles, especially if there is a change in a mole's shape or color.  Also tell your health care provider if you have a mole that is larger than the size of a pencil eraser.  Always use sunscreen. Apply sunscreen liberally and repeatedly throughout the day.  Protect yourself by wearing long sleeves, pants, a wide-brimmed hat, and sunglasses whenever you are outside. Heart disease, diabetes, and high blood pressure  High blood pressure causes heart disease and increases the risk of stroke. High blood pressure is more likely to develop in:  People who have blood pressure in the high end of the normal range (130-139/85-89 mm Hg).  People who are overweight or obese.  People who are African American.  If you are 59-24 years of age, have your blood pressure checked every 3-5 years. If you are 34 years of age or older, have your blood pressure checked every year. You should have your blood pressure measured twice-once when you are at a hospital or clinic, and once when you are not at a hospital or clinic. Record the average of the two measurements. To check your blood pressure when you are not at a hospital or clinic, you can use:  An automated blood pressure machine at a pharmacy.  A home blood pressure monitor.  If you are between 29 years and 60 years old, ask your health care provider if you should take aspirin to prevent strokes.  Have regular diabetes screenings. This involves taking a blood sample to check your fasting blood sugar level.  If you are at a normal weight and have a low risk for diabetes, have this test once  every three years after 41 years of age.  If you are overweight and have a high risk for diabetes, consider being tested at a younger age or more often. Preventing infection Hepatitis B  If you have a higher risk for hepatitis B, you should be screened for this virus. You are considered at high risk for hepatitis B if:  You were born in a country where hepatitis B is common. Ask your health care provider which countries are considered high risk.  Your parents were born in a high-risk country, and you have not been immunized against hepatitis B (hepatitis B vaccine).  You have HIV or AIDS.  You use needles to inject street drugs.  You live with someone who has hepatitis B.  You have had sex with someone who has hepatitis B.  You get hemodialysis treatment.  You take certain medicines for conditions, including cancer, organ transplantation, and autoimmune conditions. Hepatitis C  Blood testing is recommended for:  Everyone born from [birth year] through 1965.  Anyone with known risk factors for hepatitis C. Sexually transmitted infections (STIs)  You should be screened for sexually transmitted infections (STIs) including gonorrhea and chlamydia if:  You are sexually active and are younger than 41 years of age.  You are older than 41 years of age and your health care provider tells you that you are at risk for this type of infection.  Your sexual activity has changed since you were last screened and you are at an increased risk for chlamydia or gonorrhea. Ask your health care provider if you are at risk.  If you do not have HIV, but are at risk, it may be recommended that you take a prescription medicine daily to prevent HIV infection. This is called pre-exposure prophylaxis (PrEP). You are considered at risk if:  You are sexually active and do not regularly use condoms or know the HIV status of your partner(s).  You take drugs by injection.  You are sexually active with a partner  who has HIV. Talk with your health care provider about whether you are at high risk of being infected with HIV. If you choose to begin PrEP, you should first be tested for HIV. You should then be tested every 3 months for as long as you are taking PrEP. Pregnancy  If you are premenopausal and you may become pregnant, ask your health care provider about preconception counseling.  If you may become pregnant, take 400 to 800 micrograms (mcg) of folic acid  every day.  If you want to prevent pregnancy, talk to your health care provider about birth control (contraception). Osteoporosis and menopause  Osteoporosis is a disease in which the bones lose minerals and strength with aging. This can result in serious bone fractures. Your risk for osteoporosis can be identified using a bone density scan.  If you are 4 years of age or older, or if you are at risk for osteoporosis and fractures, ask your health care provider if you should be screened.  Ask your health care provider whether you should take a calcium or vitamin D supplement to lower your risk for osteoporosis.  Menopause may have certain physical symptoms and risks.  Hormone replacement therapy may reduce some of these symptoms and risks. Talk to your health care provider about whether hormone replacement therapy is right for you. Follow these instructions at home:  Schedule regular health, dental, and eye exams.  Stay current with your immunizations.  Do not use any tobacco products including cigarettes, chewing tobacco, or electronic cigarettes.  If you are pregnant, do not drink alcohol.  If you are breastfeeding, limit how much and how often you drink alcohol.  Limit alcohol intake to no more than 1 drink per day for nonpregnant women. One drink equals 12 ounces of beer, 5 ounces of wine, or 1 ounces of hard liquor.  Do not use street drugs.  Do not share needles.  Ask your health care provider for help if you need support  or information about quitting drugs.  Tell your health care provider if you often feel depressed.  Tell your health care provider if you have ever been abused or do not feel safe at home. This information is not intended to replace advice given to you by your health care provider. Make sure you discuss any questions you have with your health care provider. Document Released: 07/29/2010 Document Revised: 06/21/2015 Document Reviewed: 10/17/2014 Elsevier Interactive Patient Education  2017 [employer].

## 2016-06-10 LAB — LIPID PANEL
CHOLESTEROL: 195 mg/dL (ref 0–200)
HDL: 54.4 mg/dL (ref >39.00)
LDL Cholesterol: 107 mg/dL — ABNORMAL HIGH (ref 0–99)
NonHDL: 140.47
Total CHOL/HDL Ratio: 4
Triglycerides: 167 mg/dL — ABNORMAL HIGH (ref 0.0–149.0)
VLDL: 33.4 mg/dL (ref 0.0–40.0)

## 2016-06-10 LAB — COMPREHENSIVE METABOLIC PANEL
ALBUMIN: 4.4 g/dL (ref 3.5–5.2)
ALK PHOS: 63 U/L (ref 39–117)
ALT: 15 U/L (ref 0–35)
AST: 14 U/L (ref 0–37)
BUN: 16 mg/dL (ref 6–23)
CALCIUM: 9.9 mg/dL (ref 8.4–10.5)
CO2: 27 mEq/L (ref 19–32)
Chloride: 104 mEq/L (ref 96–112)
Creatinine, Ser: 0.76 mg/dL (ref 0.40–1.20)
GFR: 89.23 mL/min (ref >60.00)
Glucose, Bld: 66 mg/dL — ABNORMAL LOW (ref 70–99)
POTASSIUM: 4.1 meq/L (ref 3.5–5.1)
SODIUM: 137 meq/L (ref 135–145)
TOTAL PROTEIN: 7.2 g/dL (ref 6.0–8.3)
Total Bilirubin: 0.2 mg/dL (ref 0.2–1.2)

## 2016-06-10 LAB — CBC
HEMATOCRIT: 37.5 % (ref 36.0–46.0)
HEMOGLOBIN: 12.5 g/dL (ref 12.0–15.0)
MCHC: 33.3 g/dL (ref 30.0–36.0)
MCV: 81 fl (ref 78.0–100.0)
Platelets: 254 10*3/uL (ref 150.0–400.0)
RBC: 4.63 Mil/uL (ref 3.87–5.11)
RDW: 14.6 % (ref 11.5–15.5)
WBC: 8.5 10*3/uL (ref 4.0–10.5)

## 2016-06-10 LAB — TSH: TSH: 0.87 u[IU]/mL (ref 0.35–4.50)

## 2016-06-10 NOTE — Addendum Note (Signed)
Addended by: Carmin Muskrat on: 06/10/2016 11:05 AM   Modules accepted: Orders

## 2016-07-23 ENCOUNTER — Ambulatory Visit
Admission: RE | Admit: 2016-07-23 | Discharge: 2016-07-23 | Disposition: A | Discharge status: Home / Self Care | Payer: 59 | Source: Ambulatory Visit | Attending: Family Medicine | Admitting: Family Medicine

## 2016-07-23 DIAGNOSIS — Z1231 Encounter for screening mammogram for malignant neoplasm of breast: Secondary | ICD-10-CM | POA: Insufficient documentation

## 2016-07-23 DIAGNOSIS — Z1239 Encounter for other screening for malignant neoplasm of breast: Secondary | ICD-10-CM

## 2016-08-21 ENCOUNTER — Ambulatory Visit (INDEPENDENT_AMBULATORY_CARE_PROVIDER_SITE_OTHER): Payer: 59 | Admitting: Obstetrics & Gynecology

## 2016-08-21 ENCOUNTER — Encounter: Payer: Self-pay | Admitting: Obstetrics & Gynecology

## 2016-08-21 VITALS — BP 120/80 | HR 67 | Ht 66.0 in | Wt 195.0 lb

## 2016-08-21 DIAGNOSIS — Z0000 Encounter for general adult medical examination without abnormal findings: Secondary | ICD-10-CM | POA: Diagnosis not present

## 2016-08-21 DIAGNOSIS — Z3046 Encounter for surveillance of implantable subdermal contraceptive: Secondary | ICD-10-CM

## 2016-08-21 DIAGNOSIS — E669 Obesity, unspecified: Secondary | ICD-10-CM

## 2016-08-21 MED ORDER — PHENTERMINE HCL 37.5 MG PO TABS: 37.5000 mg | ORAL_TABLET | Freq: Every day | ORAL | 0 refills | Status: DC

## 2016-08-21 NOTE — Patient Instructions (Signed)

## 2016-08-21 NOTE — Progress Notes (Signed)
HPI:      Ms. RAFEEF Lindsey is a 41 y.o. G1P1001 who LMP was Patient's last menstrual period was 07/31/2016.she presents today for her annual examination. The patient has no complaints today. The patient is sexually active. Her last pap: was normal and last mammogram: was normal. The patient does perform self breast exams.  There is no notable family history of breast or ovarian cancer in her family.  The patient has regular exercise: yes.  The patient denies current symptoms of depression.    GYN History: Contraception: none  PMHx: Past Medical History:  Diagnosis Date  . Amenorrhea   . Cholelithiasis   . GERD (gastroesophageal reflux disease)   . Migraine   . Ovarian cyst    Past Surgical History:  Procedure Laterality Date  . CHOLECYSTECTOMY  2014   Family History  Problem Relation Age of Onset  . Prostate cancer Father   . Heart disease Father   . Hyperlipidemia Mother   . Heart disease Mother   . Hypertension Mother   . Stroke Paternal Grandmother    Social History  Substance Use Topics  . Smoking status: Never Smoker  . Smokeless tobacco: Never Used  . Alcohol use No    Current Outpatient Prescriptions:  .  omeprazole (PRILOSEC) 40 MG capsule, Take 1 capsule (40 mg total) by mouth daily., Disp: 90 capsule, Rfl: 3 .  phentermine (ADIPEX-P) 37.5 MG tablet, Take 1 tablet (37.5 mg total) by mouth daily before breakfast., Disp: 30 tablet, Rfl: 0 .  SUMAtriptan (IMITREX) 100 MG tablet, 1 tablet at start of migraine. May have second dose in 2 hours., Disp: 30 tablet, Rfl: 6 Allergies: Patient has no known allergies.  Review of Systems  Constitutional: Negative for chills, fever and malaise/fatigue.  HENT: Negative for congestion, sinus pain and sore throat.   Eyes: Negative for blurred vision and pain.  Respiratory: Negative for cough and wheezing.   Cardiovascular: Negative for chest pain and leg swelling.  Gastrointestinal: Negative for abdominal pain,  constipation, diarrhea, heartburn, nausea and vomiting.  Genitourinary: Negative for dysuria, frequency, hematuria and urgency.  Musculoskeletal: Negative for back pain, joint pain, myalgias and neck pain.  Skin: Negative for itching and rash.  Neurological: Negative for dizziness, tremors and weakness.  Endo/Heme/Allergies: Does not bruise/bleed easily.  Psychiatric/Behavioral: Negative for depression. The patient is not nervous/anxious and does not have insomnia.   All other systems reviewed and are negative.   Objective: BP 120/80   Pulse 67   Ht 5\' 6"  (1.676 m)   Wt 195 lb (88.5 kg)   LMP 07/31/2016   BMI 31.47 kg/m   Filed Weights   08/21/16 1620  Weight: 195 lb (88.5 kg)   Body mass index is 31.47 kg/m. Physical Exam  Constitutional: She is oriented to person, place, and time. She appears well-developed and well-nourished. No distress.  Genitourinary: Rectum normal, vagina normal and uterus normal. Pelvic exam was performed with patient supine. There is no rash or lesion on the right labia. There is no rash or lesion on the left labia. Vagina exhibits no lesion. No bleeding in the vagina. Right adnexum does not display mass and does not display tenderness. Left adnexum does not display mass and does not display tenderness. Cervix does not exhibit motion tenderness, lesion, friability or polyp.   Uterus is mobile and midaxial. Uterus is not enlarged or exhibiting a mass.  HENT:  Head: Normocephalic and atraumatic. Head is without laceration.  Right Ear: Hearing  normal.  Left Ear: Hearing normal.  Nose: No epistaxis.  No foreign bodies.  Mouth/Throat: Uvula is midline, oropharynx is clear and moist and mucous membranes are normal.  Eyes: Pupils are equal, round, and reactive to light.  Neck: Normal range of motion. Neck supple. No thyromegaly present.  Cardiovascular: Normal rate and regular rhythm.  Exam reveals no gallop and no friction rub.   No murmur  heard. Pulmonary/Chest: Effort normal and breath sounds normal. No respiratory distress. She has no wheezes. Right breast exhibits no mass, no skin change and no tenderness. Left breast exhibits no mass, no skin change and no tenderness.  Abdominal: Soft. Bowel sounds are normal. She exhibits no distension. There is no tenderness. There is no rebound.  Musculoskeletal: Normal range of motion.  Neurological: She is alert and oriented to person, place, and time. No cranial nerve deficit.  Skin: Skin is warm and dry.  Psychiatric: She has a normal mood and affect. Judgment normal.  Vitals reviewed.   Assessment:  ANNUAL EXAM 1. Annual physical exam   2. Encounter for surveillance of implantable subdermal contraceptive   3. Obesity (BMI 30-39.9)     Nexplanon Insertion  Patient given informed consent, signed copy in the chart, time out was performed. Appropriate time out taken.  Patient's left arm was prepped and draped in the usual sterile fashion.. The ruler used to measure and mark insertion area.  Pt was prepped with betadine swab and then injected with 3 cc of 2% lidocaine with epinephrine. Nexplanon removed form packaging,  Device confirmed in needle, then inserted full length of needle and withdrawn per handbook instructions.  Pt insertion site covered with steri-strip and a bandage.   Minimal blood loss.  Pt tolerated the procedure well.    Screening Plan:            1.  Cervical Screening-  Pap smear schedule reviewed with patient, due 2019  2. Breast screening- Exam annually and mammogram>40 planned   3. Colonoscopy every 10 years, Hemoccult testing - after age 18  4. Labs managed by PCP  5. Counseling for contraception: Nexplanon  Other:  1. Annual physical exam  2. Encounter for surveillance of implantable subdermal contraceptive seenote for implant  3. Obesity (BMI 30-39.9) Phentermine discussed, Rx.    F/U  Return in about 4 weeks (around 09/18/2016) for Follow  up.  Barnett Applebaum, MD, [facility], [facility] 08/21/2016  4:54 PM

## 2016-09-24 ENCOUNTER — Encounter: Payer: Self-pay | Admitting: Obstetrics & Gynecology

## 2016-09-24 ENCOUNTER — Ambulatory Visit (INDEPENDENT_AMBULATORY_CARE_PROVIDER_SITE_OTHER): Payer: 59 | Admitting: Obstetrics & Gynecology

## 2016-09-24 VITALS — BP 120/80 | HR 97 | Ht 66.0 in | Wt 186.0 lb

## 2016-09-24 DIAGNOSIS — E669 Obesity, unspecified: Secondary | ICD-10-CM

## 2016-09-24 MED ORDER — PHENTERMINE HCL 37.5 MG PO TABS: 37.5000 mg | ORAL_TABLET | Freq: Every day | ORAL | 1 refills | Status: DC

## 2016-09-24 NOTE — Progress Notes (Signed)
  History of Present Illness:  Valerie Lindsey is a 41 y.o. who was started on Phentermine approximately 4 weeks ago due to obesity/abnormal weight gain. The patient has lost 9 pounds over the past month due to lifestyle changes and meds..   She has these side effects: dry mouth.  PMHx: She  has a past medical history of Amenorrhea; Cholelithiasis; GERD (gastroesophageal reflux disease); Migraine; and Ovarian cyst. Also,  has a past surgical history that includes Cholecystectomy (2014)., family history includes Heart disease in her father and mother; Hyperlipidemia in her mother; Hypertension in her mother; Prostate cancer in her father; Stroke in her paternal grandmother.,  reports that she has never smoked. She has never used smokeless tobacco. She reports that she does not drink alcohol or use drugs.  She has a current medication list which includes the following prescription(s): omeprazole, phentermine, and sumatriptan. Also, has No Known Allergies.  Review of Systems  All other systems reviewed and are negative.   Physical Exam:  BP 120/80   Pulse 97   Ht 5\' 6"  (1.676 m)   Wt 186 lb (84.4 kg)   LMP 08/27/2016   BMI 30.02 kg/m  Body mass index is 30.02 kg/m. Filed Weights   09/24/16 1559  Weight: 186 lb (84.4 kg)   Physical Exam  Constitutional: She is oriented to person, place, and time. She appears well-developed and well-nourished. No distress.  Musculoskeletal: Normal range of motion.  Neurological: She is alert and oriented to person, place, and time.  Skin: Skin is warm and dry.  Psychiatric: She has a normal mood and affect.  Vitals reviewed.  Assessment: obesity, BMI 30 Medication treatment is going very well for her.  Plan: Patient will be continued/added to prescription appetite suppressants: Phentermine and self-directed dieting.   Will continue to assist patient in incorporating positive experiences into her life to promote a positive mental attitude.  Education  given regarding appropriate lifestyle changes for weight loss, including regular physical activity, healthy coping strategies, caloric restriction, and healthy eating patterns.  The risks and benefits as well as side effects of medication, such as Phenteramine or Tenuate, is discussed.  The pros and cons of suppressing appetite and boosting metabolism is counseled.  Risks of tolerance and addiction discussed.  Use of medicine will be short term.  Pt to call with any negative side effects and agrees to keep follow up appointments.  F/u 2 mos  Barnett Applebaum, MD, [facility], [facility] 09/24/2016  4:32 PM

## 2016-11-03 ENCOUNTER — Other Ambulatory Visit: Payer: Self-pay | Admitting: Family Medicine

## 2016-11-21 ENCOUNTER — Ambulatory Visit (INDEPENDENT_AMBULATORY_CARE_PROVIDER_SITE_OTHER): Payer: 59 | Admitting: Obstetrics & Gynecology

## 2016-11-21 ENCOUNTER — Encounter: Payer: Self-pay | Admitting: Obstetrics & Gynecology

## 2016-11-21 VITALS — BP 120/80 | Ht 66.0 in | Wt 184.0 lb

## 2016-11-21 DIAGNOSIS — E663 Overweight: Secondary | ICD-10-CM

## 2016-11-21 NOTE — Progress Notes (Signed)
  History of Present Illness:  Valerie Lindsey is a 41 y.o. who was started on  Phentermine approximately 12 weeks ago due to obesity/abnormal weight gain. The patient has lost 2 pounds over the past 2 mos due to meds, lifestyle changes..   She has these side effects: none.  She reports constipation and dry mouth.  Was not as faithful to diet this mont, still happy she lost weight, clothes feel better.  PMHx: She  has a past medical history of Amenorrhea; Cholelithiasis; GERD (gastroesophageal reflux disease); Migraine; and Ovarian cyst. Also,  has a past surgical history that includes Cholecystectomy (2014)., family history includes Heart disease in her father and mother; Hyperlipidemia in her mother; Hypertension in her mother; Prostate cancer in her father; Stroke in her paternal grandmother.,  reports that she has never smoked. She has never used smokeless tobacco. She reports that she does not drink alcohol or use drugs.  She has a current medication list which includes the following prescription(s): omeprazole, phentermine, and sumatriptan. Also, has No Known Allergies.  Review of Systems  All other systems reviewed and are negative.  Physical Exam:  BP 120/80   Ht 5\' 6"  (1.676 m)   Wt 184 lb (83.5 kg)   BMI 29.70 kg/m  Body mass index is 29.7 kg/m. Filed Weights   11/21/16 1545  Weight: 184 lb (83.5 kg)   Physical Exam  Constitutional: She is oriented to person, place, and time. She appears well-developed and well-nourished. No distress.  Musculoskeletal: Normal range of motion.  Neurological: She is alert and oriented to person, place, and time.  Skin: Skin is warm and dry.  Psychiatric: She has a normal mood and affect.  Vitals reviewed.  Assessment: overweight (no longer obese) Medication treatment is going adequately for her.  Concerned about side effects.  Plan: Patient doing well with weight loss in progress. Will stop medicine and allow for a drug-free holiday. Patient  understands she may need future therapy if weight gain resumes or she does not reach her weight loss goals on her own with good diet and exercise habits.   A total of 15 minutes were spent face-to-face with the patient during this encounter and over half of that time dealt with counseling and coordination of care.  Barnett Applebaum, MD, [facility], [facility] 11/21/2016  4:19 PM

## 2017-06-11 ENCOUNTER — Encounter: Payer: 59 | Admitting: Family Medicine

## 2017-06-12 ENCOUNTER — Encounter: Payer: Self-pay | Admitting: Family Medicine

## 2017-06-12 ENCOUNTER — Other Ambulatory Visit: Payer: Self-pay

## 2017-06-12 ENCOUNTER — Ambulatory Visit (INDEPENDENT_AMBULATORY_CARE_PROVIDER_SITE_OTHER): Payer: 59 | Admitting: Family Medicine

## 2017-06-12 VITALS — BP 100/78 | HR 77 | Temp 98.1°F | Ht 65.5 in | Wt 191.2 lb

## 2017-06-12 DIAGNOSIS — Z0000 Encounter for general adult medical examination without abnormal findings: Secondary | ICD-10-CM | POA: Diagnosis not present

## 2017-06-12 DIAGNOSIS — E6609 Other obesity due to excess calories: Secondary | ICD-10-CM | POA: Diagnosis not present

## 2017-06-12 DIAGNOSIS — Z1231 Encounter for screening mammogram for malignant neoplasm of breast: Secondary | ICD-10-CM

## 2017-06-12 DIAGNOSIS — Z6831 Body mass index (BMI) 31.0-31.9, adult: Secondary | ICD-10-CM | POA: Diagnosis not present

## 2017-06-12 DIAGNOSIS — Z862 Personal history of diseases of the blood and blood-forming organs and certain disorders involving the immune mechanism: Secondary | ICD-10-CM

## 2017-06-12 DIAGNOSIS — Z13220 Encounter for screening for lipoid disorders: Secondary | ICD-10-CM | POA: Diagnosis not present

## 2017-06-12 DIAGNOSIS — Z1239 Encounter for other screening for malignant neoplasm of breast: Secondary | ICD-10-CM

## 2017-06-12 NOTE — Progress Notes (Signed)
Eric Sonnenberg, MD Phone: [phone]  Valerie Lindsey is a 41 y.o. female who presents today for CPE.  Not exercising.  Previously did jog. Diet is described as typical American diet. Pap smear 2016- cells and negative HPV.  Done through gynecology.  She has menstrual cycles lasting 2 days once a month.  She does have a Nexplanon in place. Due for mammogram in June. No family history of breast cancer, ovarian cancer, or colon cancer. No tobacco use or illicit drug use.  Rare alcohol use. She sees a dentist twice yearly.  Sees an ophthalmologist every few years.  Active Ambulatory Problems    Diagnosis Date Noted  . Routine general medical examination at a health care facility 06/11/2015  . Migraine 06/11/2015  . Class 1 obesity due to excess calories without serious comorbidity with body mass index (BMI) of 31.0 to 31.9 in adult 06/14/2017  . History of anemia 06/14/2017   Resolved Ambulatory Problems    Diagnosis Date Noted  . Overweight (BMI 25.0-29.9) 11/21/2016   Past Medical History:  Diagnosis Date  . Amenorrhea   . Cholelithiasis   . GERD (gastroesophageal reflux disease)   . Migraine   . Ovarian cyst     Family History  Problem Relation Age of Onset  . Prostate cancer Father   . Heart disease Father   . Hyperlipidemia Mother   . Heart disease Mother   . Hypertension Mother   . Stroke Paternal Grandmother     Social History   Socioeconomic History  . Marital status: Married    Spouse name: Not on file  . Number of children: Not on file  . Years of education: Not on file  . Highest education level: Not on file  Occupational History  . Not on file  Social Needs  . Financial resource strain: Not on file  . Food insecurity:    Worry: Not on file    Inability: Not on file  . Transportation needs:    Medical: Not on file    Non-medical: Not on file  Tobacco Use  . Smoking status: Never Smoker  . Smokeless tobacco: Never Used  Substance and Sexual  Activity  . Alcohol use: No    Alcohol/week: 0.0 oz  . Drug use: No  . Sexual activity: Yes    Birth control/protection: None  Lifestyle  . Physical activity:    Days per week: Not on file    Minutes per session: Not on file  . Stress: Not on file  Relationships  . Social connections:    Talks on phone: Not on file    Gets together: Not on file    Attends religious service: Not on file    Active member of club or organization: Not on file    Attends meetings of clubs or organizations: Not on file    Relationship status: Not on file  . Intimate partner violence:    Fear of current or ex partner: Not on file    Emotionally abused: Not on file    Physically abused: Not on file    Forced sexual activity: Not on file  Other Topics Concern  . Not on file  Social History Narrative  . Not on file    ROS  General:  Negative for nexplained weight loss, fever Skin: Negative for new or changing mole, sore that won't heal HEENT: Negative for trouble hearing, trouble seeing, ringing in ears, mouth sores, hoarseness, change in voice, dysphagia. CV:  Negative   for chest pain, dyspnea, edema, palpitations Resp: Negative for cough, dyspnea, hemoptysis GI: Negative for nausea, vomiting, diarrhea, constipation, abdominal pain, melena, hematochezia. GU: Negative for dysuria, incontinence, urinary hesitance, hematuria, vaginal or penile discharge, polyuria, sexual difficulty, lumps in testicle or breasts MSK: Negative for muscle cramps or aches, joint pain or swelling Neuro: Negative for headaches, weakness, numbness, dizziness, passing out/fainting Psych: Negative for depression, anxiety, memory problems  Objective  Physical Exam Vitals:   06/12/17 1534  BP: 100/78  Pulse: 77  Temp: 98.1 F (36.7 C)  SpO2: 97%    BP Readings from Last 3 Encounters:  06/12/17 100/78  11/21/16 120/80  09/24/16 120/80   Wt Readings from Last 3 Encounters:  06/12/17 191 lb 3.2 oz (86.7 kg)    11/21/16 184 lb (83.5 kg)  09/24/16 186 lb (84.4 kg)    Physical Exam  Constitutional: No distress.  HENT:  Head: Normocephalic and atraumatic.  Mouth/Throat: Oropharynx is clear and moist.  Eyes: Pupils are equal, round, and reactive to light. Conjunctivae are normal.  Neck: Neck supple.  Cardiovascular: Normal rate, regular rhythm and normal heart sounds.  Pulmonary/Chest: Effort normal and breath sounds normal.  Abdominal: Soft. Bowel sounds are normal. She exhibits no distension and no mass. There is no tenderness. No hernia.  Musculoskeletal: She exhibits no edema.  Lymphadenopathy:    She has no cervical adenopathy.  Neurological: She is alert.  Skin: Skin is warm and dry. She is not diaphoretic.  Psychiatric: She has a normal mood and affect.     Assessment/Plan:   Routine general medical examination at a health care facility physical exam completed.  Pelvic and breast exam deferred to gynecology.  She will start walking every other day for exercise.  She will work on her diet as well.  Mammogram ordered.  Lab work as outlined below.  History of anemia Patient reports history of anemia.  CBC ordered.   Orders Placed This Encounter  Procedures  . MM 3D SCREEN BREAST BILATERAL    Standing Status:   Future    Standing Expiration Date:   08/13/2018    Order Specific Question:   Reason for Exam (SYMPTOM  OR DIAGNOSIS REQUIRED)    Answer:   breast cancer screening    Order Specific Question:   Is the patient pregnant?    Answer:   No    Order Specific Question:   Preferred imaging location?    Answer:   [facility]  . CBC  . Comp Met (CMET)  . Lipid panel  . HgB A1c    No orders of the defined types were placed in this encounter.    Eric Sonnenberg, MD [facility] [place]  

## 2017-06-12 NOTE — Patient Instructions (Signed)
Nice to see you. Please start walking every other day for exercise.  Please try to monitor your diet as well. We will check lab work today and contact you with the results. Please call the breast center to have your mammogram scheduled for 07/24/17 or later.

## 2017-06-13 LAB — COMPREHENSIVE METABOLIC PANEL
AG RATIO: 1.8 (calc) (ref 1.0–2.5)
ALBUMIN MSPROF: 4.5 g/dL (ref 3.6–5.1)
ALT: 11 U/L (ref 6–29)
AST: 14 U/L (ref 10–30)
Alkaline phosphatase (APISO): 69 U/L (ref 33–115)
BILIRUBIN TOTAL: 0.2 mg/dL (ref 0.2–1.2)
BUN: 13 mg/dL (ref 7–25)
CALCIUM: 9.8 mg/dL (ref 8.6–10.2)
CHLORIDE: 107 mmol/L (ref 98–110)
CO2: 28 mmol/L (ref 20–32)
Creat: 0.76 mg/dL (ref 0.50–1.10)
GLOBULIN: 2.5 g/dL (ref 1.9–3.7)
Glucose, Bld: 88 mg/dL (ref 65–99)
POTASSIUM: 4.4 mmol/L (ref 3.5–5.3)
SODIUM: 141 mmol/L (ref 135–146)
Total Protein: 7 g/dL (ref 6.1–8.1)

## 2017-06-13 LAB — CBC
HEMATOCRIT: 37.4 % (ref 35.0–45.0)
HEMOGLOBIN: 12.9 g/dL (ref 11.7–15.5)
MCH: 27.6 pg (ref 27.0–33.0)
MCHC: 34.5 g/dL (ref 32.0–36.0)
MCV: 79.9 fL — ABNORMAL LOW (ref 80.0–100.0)
MPV: 10.4 fL (ref 7.5–12.5)
Platelets: 232 10*3/uL (ref 140–400)
RBC: 4.68 10*6/uL (ref 3.80–5.10)
RDW: 13.3 % (ref 11.0–15.0)
WBC: 7.9 10*3/uL (ref 3.8–10.8)

## 2017-06-13 LAB — HEMOGLOBIN A1C
HEMOGLOBIN A1C: 5.3 %{Hb} (ref <5.7)
Mean Plasma Glucose: 105 (calc)
eAG (mmol/L): 5.8 (calc)

## 2017-06-13 LAB — LIPID PANEL
Cholesterol: 213 mg/dL — ABNORMAL HIGH (ref <200)
HDL: 57 mg/dL (ref >50)
LDL Cholesterol (Calc): 127 mg/dL (calc) — ABNORMAL HIGH
NON-HDL CHOLESTEROL (CALC): 156 mg/dL — AB (ref <130)
Total CHOL/HDL Ratio: 3.7 (calc) (ref <5.0)
Triglycerides: 177 mg/dL — ABNORMAL HIGH (ref <150)

## 2017-06-14 DIAGNOSIS — E6609 Other obesity due to excess calories: Secondary | ICD-10-CM | POA: Insufficient documentation

## 2017-06-14 DIAGNOSIS — E669 Obesity, unspecified: Secondary | ICD-10-CM | POA: Insufficient documentation

## 2017-06-14 DIAGNOSIS — Z6831 Body mass index (BMI) 31.0-31.9, adult: Secondary | ICD-10-CM

## 2017-06-14 DIAGNOSIS — Z862 Personal history of diseases of the blood and blood-forming organs and certain disorders involving the immune mechanism: Secondary | ICD-10-CM | POA: Insufficient documentation

## 2017-06-14 NOTE — Assessment & Plan Note (Signed)
Patient reports history of anemia.  CBC ordered.

## 2017-06-14 NOTE — Assessment & Plan Note (Signed)
physical exam completed.  Pelvic and breast exam deferred to gynecology.  She will start walking every other day for exercise.  She will work on her diet as well.  Mammogram ordered.  Lab work as outlined below.

## 2017-06-15 ENCOUNTER — Other Ambulatory Visit: Payer: Self-pay | Admitting: Family Medicine

## 2017-06-15 NOTE — Telephone Encounter (Signed)
Historical from Dr. Lacinda Axon ok to refill?

## 2017-08-10 ENCOUNTER — Ambulatory Visit: Payer: 59

## 2017-08-26 ENCOUNTER — Ambulatory Visit
Admission: RE | Admit: 2017-08-26 | Discharge: 2017-08-26 | Disposition: A | Discharge status: Home / Self Care | Payer: 59 | Source: Ambulatory Visit | Attending: Family Medicine | Admitting: Family Medicine

## 2017-08-26 DIAGNOSIS — Z1231 Encounter for screening mammogram for malignant neoplasm of breast: Secondary | ICD-10-CM | POA: Diagnosis not present

## 2017-08-26 DIAGNOSIS — Z1239 Encounter for other screening for malignant neoplasm of breast: Secondary | ICD-10-CM

## 2017-10-30 ENCOUNTER — Other Ambulatory Visit: Payer: Self-pay | Admitting: Family Medicine

## 2017-10-30 ENCOUNTER — Encounter: Payer: Self-pay | Admitting: Family Medicine

## 2017-11-02 ENCOUNTER — Other Ambulatory Visit: Payer: Self-pay

## 2017-11-02 MED ORDER — OMEPRAZOLE 40 MG PO CPDR
40.0000 mg | DELAYED_RELEASE_CAPSULE | Freq: Every day | ORAL | 3 refills | Status: DC
Start: 2017-11-02 — End: 2018-10-14

## 2018-04-11 ENCOUNTER — Ambulatory Visit
Admission: EM | Admit: 2018-04-11 | Discharge: 2018-04-11 | Disposition: A | Discharge status: Home / Self Care | Payer: 59 | Attending: Family Medicine | Admitting: Family Medicine

## 2018-04-11 ENCOUNTER — Encounter: Payer: Self-pay | Admitting: Gynecology

## 2018-04-11 ENCOUNTER — Other Ambulatory Visit: Payer: Self-pay

## 2018-04-11 DIAGNOSIS — J101 Influenza due to other identified influenza virus with other respiratory manifestations: Secondary | ICD-10-CM | POA: Diagnosis not present

## 2018-04-11 DIAGNOSIS — R509 Fever, unspecified: Secondary | ICD-10-CM

## 2018-04-11 DIAGNOSIS — B9789 Other viral agents as the cause of diseases classified elsewhere: Secondary | ICD-10-CM | POA: Diagnosis not present

## 2018-04-11 DIAGNOSIS — R05 Cough: Secondary | ICD-10-CM | POA: Diagnosis not present

## 2018-04-11 DIAGNOSIS — R51 Headache: Secondary | ICD-10-CM | POA: Diagnosis not present

## 2018-04-11 LAB — RAPID INFLUENZA A&B ANTIGENS
Influenza A (ARMC): NEGATIVE
Influenza B (ARMC): POSITIVE — AB

## 2018-04-11 MED ORDER — BALOXAVIR MARBOXIL(80 MG DOSE) 2 X 40 MG PO TBPK: 80.0000 mg | ORAL_TABLET | Freq: Once | ORAL | 0 refills | Status: AC

## 2018-04-11 MED ORDER — HYDROCODONE-HOMATROPINE 5-1.5 MG/5ML PO SYRP: 5.0000 mL | ORAL_SOLUTION | Freq: Four times a day (QID) | ORAL | 0 refills | Status: DC | PRN

## 2018-04-11 NOTE — ED Provider Notes (Signed)
[facility]    CSN: [id number] Arrival date & time: 04/11/18  1135   History   Chief Complaint Fever, Cough, Headache  HPI  43 year old female presents with the above complaints.  Patient reports her symptoms started yesterday.  Reports fever, cough, headache.  She is currently febrile at 101.1.  Patient works at the hospital.  No exacerbating or relieving factors.  Symptoms are moderate in severity.  No other reported symptoms.  No other complaints or concerns at this time  PMH, Surgical Hx, Family Hx, Social History reviewed and updated as below.  Past Medical History:  Diagnosis Date   Amenorrhea    Cholelithiasis    GERD (gastroesophageal reflux disease)    Migraine    Ovarian cyst     Patient Active Problem List   Diagnosis Date Noted   Class 1 obesity due to excess calories without serious comorbidity with body mass index (BMI) of 31.0 to 31.9 in adult 06/14/2017   History of anemia 06/14/2017   Routine general medical examination at a health care facility 06/11/2015   Migraine 06/11/2015    Past Surgical History:  Procedure Laterality Date   CHOLECYSTECTOMY  2014    OB History    Gravida  1   Para  1   Term  1   Preterm      AB      Living  1     SAB      TAB      Ectopic      Multiple      Live Births               Home Medications    Prior to Admission medications   Medication Sig Start Date End Date Taking? Authorizing Provider  omeprazole (PRILOSEC) 40 MG capsule Take 1 capsule (40 mg total) by mouth daily. 11/02/17  Yes Leone Haven, MD  SUMAtriptan (IMITREX) 100 MG tablet TAKE 1 TABLET AT START OF MIGRAINE. MAY HAVE SECOND DOSE IN 2 HOURS. 06/15/17  Yes Leone Haven, MD  Baloxavir Marboxil,80 MG Dose, (XOFLUZA) 2 x 40 MG TBPK Take 80 mg by mouth once for 1 dose. 04/11/18 04/11/18  Coral Spikes, DO  HYDROcodone-homatropine (HYCODAN) 5-1.5 MG/5ML syrup Take 5 mLs by mouth every 6 (six) hours as  needed. 04/11/18   Coral Spikes, DO    Family History Family History  Problem Relation Age of Onset   Prostate cancer Father    Heart disease Father    Hyperlipidemia Mother    Heart disease Mother    Hypertension Mother    Stroke Paternal Grandmother    Breast cancer Neg Hx     Social History Social History   Tobacco Use   Smoking status: Never Smoker   Smokeless tobacco: Never Used  Substance Use Topics   Alcohol use: No    Alcohol/week: 0.0 standard drinks   Drug use: No     Allergies   Patient has no known allergies.   Review of Systems Review of Systems  Constitutional: Positive for fever.  Respiratory: Positive for cough.   Neurological: Positive for headaches.   Physical Exam Triage Vital Signs ED Triage Vitals  Enc Vitals Group     BP 04/11/18 1204 126/84     Pulse Rate 04/11/18 1204 (!) 106     Resp 04/11/18 1204 18     Temp 04/11/18 1204 (!) 101.1 F (38.4 C)     Temp Source 04/11/18 1204  Oral     SpO2 04/11/18 1204 99 %     Weight 04/11/18 1200 190 lb (86.2 kg)     Height 04/11/18 1200 5\' 6"  (1.676 m)     Head Circumference --      Peak Flow --      Pain Score 04/11/18 1200 3     Pain Loc --      Pain Edu? --      Excl. in [place]? --    Updated Vital Signs BP 126/84 (BP Location: Left Arm)    Pulse (!) 106    Temp (!) 101.1 F (38.4 C) (Oral)    Resp 18    Ht 5\' 6"  (1.676 m)    Wt 86.2 kg    LMP 04/09/2018    SpO2 99%    BMI 30.67 kg/m   Visual Acuity Right Eye Distance:   Left Eye Distance:   Bilateral Distance:    Right Eye Near:   Left Eye Near:    Bilateral Near:     Physical Exam Vitals signs and nursing note reviewed.  Constitutional:      General: She is not in acute distress.    Appearance: Normal appearance.  HENT:     Head: Normocephalic.     Right Ear: Tympanic membrane normal.     Left Ear: Tympanic membrane normal.     Mouth/Throat:     Mouth: Mucous membranes are moist.     Pharynx: No oropharyngeal  exudate.  Eyes:     General:        Right eye: No discharge.        Left eye: No discharge.     Conjunctiva/sclera: Conjunctivae normal.  Cardiovascular:     Rate and Rhythm: Regular rhythm. Tachycardia present.  Pulmonary:     Effort: Pulmonary effort is normal.     Breath sounds: Normal breath sounds.  Neurological:     Mental Status: She is alert.  Psychiatric:        Mood and Affect: Mood normal.    UC Treatments / Results  Labs (all labs ordered are listed, but only abnormal results are displayed) Labs Reviewed  RAPID INFLUENZA A&B ANTIGENS ([facility] ONLY) - Abnormal; Notable for the following components:      Result Value   Influenza B ([facility]) POSITIVE (*)    All other components within normal limits    EKG None  Radiology No results found.  Procedures Procedures (including critical care time)  Medications Ordered in UC Medications - No data to display  Initial Impression / Assessment and Plan / UC Course  I have reviewed the triage vital signs and the nursing notes.  Pertinent labs & imaging results that were available during my care of the patient were reviewed by me and considered in my medical decision making (see chart for details).    43 year old female presents with influenza.  Treating with Xofluza.  Hycodan for cough.  Final Clinical Impressions(s) / UC Diagnoses   Final diagnoses:  Influenza B   Discharge Instructions   None    ED Prescriptions    Medication Sig Dispense Auth. Provider   Baloxavir Marboxil,80 MG Dose, (XOFLUZA) 2 x 40 MG TBPK Take 80 mg by mouth once for 1 dose. 2 each Coral Spikes, DO   HYDROcodone-homatropine (HYCODAN) 5-1.5 MG/5ML syrup Take 5 mLs by mouth every 6 (six) hours as needed. 120 mL Thersa Salt G, DO     Controlled Substance Prescriptions [place] Controlled  Substance Registry consulted? Not Applicable   Coral Spikes, DO 04/11/18 1339

## 2018-04-11 NOTE — ED Triage Notes (Signed)
Per patient c/o x yesterday with low grade fever. Today with temperature with 102 and cough / headace. 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

## 2018-04-20 MED FILL — SUMATRIPTAN SUCC 100 MG TAB: 100 | 30 days supply | Qty: 9 | Fill #0 | Status: TO

## 2018-04-20 MED FILL — OMEPRAZOLE 40 MG CPDR: 40 | 90 days supply | Qty: 90 | Fill #0

## 2018-06-14 ENCOUNTER — Encounter: Payer: 59 | Admitting: Family Medicine

## 2018-07-26 ENCOUNTER — Other Ambulatory Visit: Payer: Self-pay | Admitting: Family Medicine

## 2018-09-02 ENCOUNTER — Telehealth: Payer: Self-pay

## 2018-09-02 NOTE — Telephone Encounter (Signed)
Copied from [place] [phone]. Topic: Quick Communication - See Telephone Encounter >> Sep 02, 2018  3:41 PM Loma [place] wrote: CRM for notification. See Telephone encounter for: 09/02/18. Please return screen call for pt. They held for 6 min and said they could not continue to hold.

## 2018-09-03 ENCOUNTER — Other Ambulatory Visit: Payer: Self-pay

## 2018-09-06 ENCOUNTER — Other Ambulatory Visit: Payer: Self-pay

## 2018-09-06 ENCOUNTER — Encounter: Payer: Self-pay | Admitting: Family Medicine

## 2018-09-06 ENCOUNTER — Ambulatory Visit (INDEPENDENT_AMBULATORY_CARE_PROVIDER_SITE_OTHER): Payer: 59 | Admitting: Family Medicine

## 2018-09-06 VITALS — BP 122/80 | HR 91 | Temp 97.1°F | Ht 65.0 in | Wt 210.4 lb

## 2018-09-06 DIAGNOSIS — Z1329 Encounter for screening for other suspected endocrine disorder: Secondary | ICD-10-CM

## 2018-09-06 DIAGNOSIS — E669 Obesity, unspecified: Secondary | ICD-10-CM | POA: Diagnosis not present

## 2018-09-06 DIAGNOSIS — G43709 Chronic migraine without aura, not intractable, without status migrainosus: Secondary | ICD-10-CM | POA: Diagnosis not present

## 2018-09-06 DIAGNOSIS — Z13220 Encounter for screening for lipoid disorders: Secondary | ICD-10-CM | POA: Diagnosis not present

## 2018-09-06 DIAGNOSIS — Z1239 Encounter for other screening for malignant neoplasm of breast: Secondary | ICD-10-CM

## 2018-09-06 DIAGNOSIS — Z0001 Encounter for general adult medical examination with abnormal findings: Secondary | ICD-10-CM | POA: Diagnosis not present

## 2018-09-06 DIAGNOSIS — Z124 Encounter for screening for malignant neoplasm of cervix: Secondary | ICD-10-CM | POA: Diagnosis not present

## 2018-09-06 DIAGNOSIS — Z130 Encounter for screening for diseases of the blood and blood-forming organs and certain disorders involving the immune mechanism: Secondary | ICD-10-CM

## 2018-09-06 MED ORDER — PROPRANOLOL HCL 20 MG PO TABS: 20.0000 mg | ORAL_TABLET | Freq: Two times a day (BID) | ORAL | 1 refills | Status: DC

## 2018-09-06 NOTE — Assessment & Plan Note (Signed)
Physical exam completed.  Encouraged adding in exercise and dietary changes.  Discussed trying to cook on the weekend so she can have leftovers to eat healthier meals during the week.  Pap smear attempted though I was unable to complete this given difficulties getting the cervix into view.  She will contact her gynecologist to arrange for an appointment with them.  Mammogram ordered.  She will call to schedule this.  Lab work as outlined below.

## 2018-09-06 NOTE — Progress Notes (Signed)
Tommi Rumps, MD Phone: [phone]  Valerie Lindsey is a 43 y.o. female who presents today for cpe.  Exercise: Not exercising. Diet: Eating a lot of fast food.  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 Pap smears previously completed through GYN.  Based on review of GYN note from 2018 she is due for a Pap smear currently. Due for mammogram. Menstrual cycles once monthly lasting 4 days.  She has a Nexplanon in place. No family history of breast cancer, ovarian cancer, or colon cancer. No tobacco use or illicit drug use.  No alcohol use. She sees a Pharmacist, community.  She occasionally will see an ophthalmologist though she notes no eye issues.  Migraines: Patient notes chronic migraines though frequency has increased to about once weekly.  They can last 1 to 2 days at times.  Typically occur left-sided and has associated nausea.  No aura.  No vision changes.  No numbness or weakness.  She wonders about Topamax as a potential treatment.  She takes Imitrex though this can make her feel poorly at times.  Active Ambulatory Problems    Diagnosis Date Noted   Encounter for general adult medical examination with abnormal findings 06/11/2015   Migraine 06/11/2015   Class 1 obesity due to excess calories without serious comorbidity with body mass index (BMI) of 31.0 to 31.9 in adult 06/14/2017   History of anemia 06/14/2017   Resolved Ambulatory Problems    Diagnosis Date Noted   Overweight (BMI 25.0-29.9) 11/21/2016   Past Medical History:  Diagnosis Date   Amenorrhea    Cholelithiasis    GERD (gastroesophageal reflux disease)    Ovarian cyst     Family History  Problem Relation Age of Onset   Prostate cancer Father    Heart disease Father    Hyperlipidemia Mother    Heart disease Mother    Hypertension Mother    Stroke Paternal Grandmother    Breast cancer Neg Hx     Social History   Socioeconomic History   Marital  status: Married    Spouse name: Not on file   Number of children: Not on file   Years of education: Not on file   Highest education level: Not on file  Occupational History   Not on file  Social Needs   Financial resource strain: Not on file   Food insecurity    Worry: Not on file    Inability: Not on file   Transportation needs    Medical: Not on file    Non-medical: Not on file  Tobacco Use   Smoking status: Never Smoker   Smokeless tobacco: Never Used  Substance and Sexual Activity   Alcohol use: No    Alcohol/week: 0.0 standard drinks   Drug use: No   Sexual activity: Yes    Birth control/protection: None  Lifestyle   Physical activity    Days per week: Not on file    Minutes per session: Not on file   Stress: Not on file  Relationships   Social connections    Talks on phone: Not on file    Gets together: Not on file    Attends religious service: Not on file    Active member of club or organization: Not on file    Attends meetings of clubs or organizations: Not on file    Relationship status: Not on file   Intimate partner violence    Fear  of current or ex partner: Not on file    Emotionally abused: Not on file    Physically abused: Not on file    Forced sexual activity: Not on file  Other Topics Concern   Not on file  Social History Narrative   Not on file    ROS  General:  Negative for nexplained weight loss, fever Skin: Negative for new or changing mole, sore that won't heal HEENT: Negative for trouble hearing, trouble seeing, ringing in ears, mouth sores, hoarseness, change in voice, dysphagia. CV:  Negative for chest pain, dyspnea, edema, palpitations Resp: Negative for cough, dyspnea, hemoptysis GI: Negative for nausea, vomiting, diarrhea, constipation, abdominal pain, melena, hematochezia. GU: Negative for dysuria, incontinence, urinary hesitance, hematuria, vaginal or penile discharge, polyuria, sexual difficulty, lumps in testicle  or breasts MSK: Negative for muscle cramps or aches, joint pain or swelling Neuro: Positive for headaches, negative for weakness, numbness, dizziness, passing out/fainting Psych: Negative for depression, anxiety, memory problems  Objective  Physical Exam Vitals:   09/06/18 1446  BP: 122/80  Pulse: 91  Temp: (!) 97.1 F (36.2 C)  SpO2: 98%    BP Readings from Last 3 Encounters:  09/06/18 122/80  04/11/18 126/84  06/12/17 100/78   Wt Readings from Last 3 Encounters:  09/06/18 210 lb 6.4 oz (95.4 kg)  04/11/18 190 lb (86.2 kg)  06/12/17 191 lb 3.2 oz (86.7 kg)    Physical Exam Constitutional:      General: She is not in acute distress.    Appearance: She is not diaphoretic.  HENT:     Head: Normocephalic and atraumatic.     Mouth/Throat:     Mouth: Mucous membranes are moist.     Pharynx: Oropharynx is clear.  Eyes:     Conjunctiva/sclera: Conjunctivae normal.     Pupils: Pupils are equal, round, and reactive to light.  Cardiovascular:     Rate and Rhythm: Normal rate and regular rhythm.     Heart sounds: Normal heart sounds.  Pulmonary:     Effort: Pulmonary effort is normal.     Breath sounds: Normal breath sounds.  Abdominal:     General: Bowel sounds are normal. There is no distension.     Palpations: Abdomen is soft.     Tenderness: There is no abdominal tenderness. There is no guarding or rebound.  Genitourinary:    Comments: Chaperone used, normal labia, normal vaginal mucosa, I was unable to get the cervix to come into view on speculum exam, bilateral breast with no skin changes, masses, tenderness, or nipple inversion, no axillary masses bilaterally Musculoskeletal:     Right lower leg: No edema.     Left lower leg: No edema.  Skin:    General: Skin is warm and dry.  Neurological:     Mental Status: She is alert.     Comments: CN 3-12 intact, 5/5 strength in bilateral biceps, triceps, grip, quads, hamstrings, plantar and dorsiflexion, sensation to  light touch intact in bilateral UE and LE, normal gait  Psychiatric:        Mood and Affect: Mood normal.      Assessment/Plan:   Encounter for general adult medical examination with abnormal findings Physical exam completed.  Encouraged adding in exercise and dietary changes.  Discussed trying to cook on the weekend so she can have leftovers to eat healthier meals during the week.  Pap smear attempted though I was unable to complete this given difficulties getting the cervix into view.  She will contact her gynecologist to arrange for an appointment with them.  Mammogram ordered.  She will call to schedule this.  Lab work as outlined below.  Migraine Migraines have had increasing frequency and sometimes increased duration.  Discussed preventative medication.  I would like to use propranolol and this will be sent to her pharmacy.  She will monitor her blood pressure to ensure that it does not decrease significantly with this medication.  I also discussed the potential for bradycardia and fatigue with this and to let us know immediately if this occurred.   Orders Placed This Encounter  Procedures   MM 3D SCREEN BREAST BILATERAL    Standing Status:   Future    Standing Expiration Date:   11/06/2019    Order Specific Question:   Reason for Exam (SYMPTOM  OR DIAGNOSIS REQUIRED)    Answer:   breast cancer screening    Order Specific Question:   Is the patient pregnant?    Answer:   No    Order Specific Question:   Preferred imaging location?    Answer:   [facility]   Lipid panel   Comp Met (CMET)   CBC   TSH   HgB A1c    Meds ordered this encounter  Medications   propranolol (INDERAL) 20 MG tablet    Sig: Take 1 tablet (20 mg total) by mouth 2 (two) times daily.    Dispense:  60 tablet    Refill:  [place], MD [place]

## 2018-09-06 NOTE — Patient Instructions (Signed)
Nice to see you. Please start walking for exercise 2-3 days a week.  Please monitor your diet. I have included diet instructions below.  We will call with your labs.  If you develop light headedness, fatigue, slow heart rate, or other new symptoms after starting the propranolol for your migraines please let  us know.

## 2018-09-06 NOTE — Assessment & Plan Note (Signed)
Migraines have had increasing frequency and sometimes increased duration.  Discussed preventative medication.  I would like to use propranolol and this will be sent to her pharmacy.  She will monitor her blood pressure to ensure that it does not decrease significantly with this medication.  I also discussed the potential for bradycardia and fatigue with this and to let us know immediately if this occurred.

## 2018-09-07 LAB — COMPREHENSIVE METABOLIC PANEL
ALT: 15 U/L (ref 0–35)
AST: 16 U/L (ref 0–37)
Albumin: 4.4 g/dL (ref 3.5–5.2)
Alkaline Phosphatase: 72 U/L (ref 39–117)
BUN: 14 mg/dL (ref 6–23)
CO2: 25 mEq/L (ref 19–32)
Calcium: 9.6 mg/dL (ref 8.4–10.5)
Chloride: 104 mEq/L (ref 96–112)
Creatinine, Ser: 0.74 mg/dL (ref 0.40–1.20)
GFR: 85.64 mL/min (ref >60.00)
Glucose, Bld: 108 mg/dL — ABNORMAL HIGH (ref 70–99)
Potassium: 3.7 mEq/L (ref 3.5–5.1)
Sodium: 137 mEq/L (ref 135–145)
Total Bilirubin: 0.2 mg/dL (ref 0.2–1.2)
Total Protein: 6.6 g/dL (ref 6.0–8.3)

## 2018-09-07 LAB — LIPID PANEL
Cholesterol: 188 mg/dL (ref 0–200)
HDL: 46 mg/dL (ref >39.00)
LDL Cholesterol: 118 mg/dL — ABNORMAL HIGH (ref 0–99)
NonHDL: 141.82
Total CHOL/HDL Ratio: 4
Triglycerides: 120 mg/dL (ref 0.0–149.0)
VLDL: 24 mg/dL (ref 0.0–40.0)

## 2018-09-07 LAB — HEMOGLOBIN A1C: Hgb A1c MFr Bld: 5.7 % (ref 4.6–6.5)

## 2018-09-07 LAB — CBC
HCT: 37.6 % (ref 36.0–46.0)
Hemoglobin: 12.6 g/dL (ref 12.0–15.0)
MCHC: 33.5 g/dL (ref 30.0–36.0)
MCV: 80.2 fl (ref 78.0–100.0)
Platelets: 239 10*3/uL (ref 150.0–400.0)
RBC: 4.69 Mil/uL (ref 3.87–5.11)
RDW: 14.8 % (ref 11.5–15.5)
WBC: 6.9 10*3/uL (ref 4.0–10.5)

## 2018-09-07 LAB — TSH: TSH: 0.79 u[IU]/mL (ref 0.35–4.50)

## 2018-09-20 ENCOUNTER — Encounter: Payer: Self-pay | Admitting: Family Medicine

## 2018-10-13 ENCOUNTER — Other Ambulatory Visit: Payer: Self-pay | Admitting: Family Medicine

## 2018-11-08 ENCOUNTER — Other Ambulatory Visit: Payer: Self-pay

## 2018-11-08 ENCOUNTER — Ambulatory Visit (INDEPENDENT_AMBULATORY_CARE_PROVIDER_SITE_OTHER): Payer: 59 | Admitting: Family Medicine

## 2018-11-08 ENCOUNTER — Encounter: Payer: Self-pay | Admitting: Family Medicine

## 2018-11-08 DIAGNOSIS — G43709 Chronic migraine without aura, not intractable, without status migrainosus: Secondary | ICD-10-CM | POA: Diagnosis not present

## 2018-11-08 MED ORDER — PROPRANOLOL HCL 20 MG PO TABS: 20.0000 mg | ORAL_TABLET | Freq: Two times a day (BID) | ORAL | 1 refills | Status: DC

## 2018-11-08 NOTE — Progress Notes (Signed)
Virtual Visit via video Note  This visit type was conducted due to national recommendations for restrictions regarding the COVID-19 pandemic (e.g. social distancing).  This format is felt to be most appropriate for this patient at this time.  All issues noted in this document were discussed and addressed.  No physical exam was performed (except for noted visual exam findings with Video Visits).   I connected with Valerie Lindsey today at  4:30 PM EDT by a video enabled telemedicine application and verified that I am speaking with the correct person using two identifiers. Location patient: [facility], [facility] provider: work  Persons participating in the virtual visit: patient, provider  I discussed the limitations, risks, security and privacy concerns of performing an evaluation and management service by telephone and the availability of in person appointments. I also discussed with the patient that there may be a patient responsible charge related to this service. The patient expressed understanding and agreed to proceed.  Reason for visit: follow-up  HPI: Migraines: Patient notes these are significantly improved.  Since starting on the propranolol she has only had one migraine that required Imitrex.  She has had a couple mild headaches other times though those resolved on their own.  She notes no numbness, weakness, or vision changes.  She has not had any lightheadedness.  She has not checked her blood pressure.  She did have a pulse rate down to about 55 on a couple of occasions though she skipped her evening dose of propranolol and had no further issues with this.  She has had no fatigue since starting the propranolol.   ROS: See pertinent positives and negatives per HPI.  Past Medical History:  Diagnosis Date  . Amenorrhea   . Cholelithiasis   . GERD (gastroesophageal reflux disease)   . Migraine   . Ovarian cyst     Past Surgical History:  Procedure Laterality Date  . CHOLECYSTECTOMY   2014    Family History  Problem Relation Age of Onset  . Prostate cancer Father   . Heart disease Father   . Hyperlipidemia Mother   . Heart disease Mother   . Hypertension Mother   . Stroke Paternal Grandmother   . Breast cancer Neg Hx     SOCIAL HX: Non-smoker.   Current Outpatient Medications:  .  HYDROcodone-homatropine (HYCODAN) 5-1.5 MG/5ML syrup, Take 5 mLs by mouth every 6 (six) hours as needed., Disp: 120 mL, Rfl: 0 .  omeprazole (PRILOSEC) 40 MG capsule, TAKE 1 CAPSULE (40 MG TOTAL) BY MOUTH DAILY., Disp: 90 capsule, Rfl: 0 .  propranolol (INDERAL) 20 MG tablet, Take 1 tablet (20 mg total) by mouth 2 (two) times daily., Disp: 180 tablet, Rfl: 1 .  SUMAtriptan (IMITREX) 100 MG tablet, TAKE 1 TABLET AT START OF MIGRAINE. MAY HAVE SECOND DOSE IN 2 HOURS., Disp: 30 tablet, Rfl: 5  EXAM:  VITALS per patient if applicable: None.  GENERAL: alert, oriented, appears well and in no acute distress  HEENT: atraumatic, conjunttiva clear, no obvious abnormalities on inspection of external nose and ears  NECK: normal movements of the head and neck  LUNGS: on inspection no signs of respiratory distress, breathing rate appears normal, no obvious gross SOB, gasping or wheezing  CV: no obvious cyanosis  MS: moves all visible extremities without noticeable abnormality  PSYCH/NEURO: pleasant and cooperative, no obvious depression or anxiety, speech and thought processing grossly intact  ASSESSMENT AND PLAN:  Discussed the following assessment and plan:  Migraine Much improved on  propranolol.  She will continue with this.  She will monitor for fatigue and low pulse rate as well as lightheadedness.  Follow-up in 6 months.    I discussed the assessment and treatment plan with the patient. The patient was provided an opportunity to ask questions and all were answered. The patient agreed with the plan and demonstrated an understanding of the instructions.   The patient was advised  to call back or seek an in-person evaluation if the symptoms worsen or if the condition fails to improve as anticipated.   Tommi Rumps, MD

## 2018-11-08 NOTE — Assessment & Plan Note (Signed)
Much improved on propranolol.  She will continue with this.  She will monitor for fatigue and low pulse rate as well as lightheadedness.  Follow-up in 6 months.

## 2018-11-09 ENCOUNTER — Ambulatory Visit
Admission: RE | Admit: 2018-11-09 | Discharge: 2018-11-09 | Disposition: A | Discharge status: Home / Self Care | Payer: 59 | Source: Ambulatory Visit | Attending: Family Medicine | Admitting: Family Medicine

## 2018-11-09 DIAGNOSIS — Z1239 Encounter for other screening for malignant neoplasm of breast: Secondary | ICD-10-CM

## 2018-11-09 DIAGNOSIS — Z1231 Encounter for screening mammogram for malignant neoplasm of breast: Secondary | ICD-10-CM | POA: Diagnosis not present

## 2019-01-13 ENCOUNTER — Other Ambulatory Visit: Payer: Self-pay | Admitting: Family Medicine

## 2019-01-26 DIAGNOSIS — H5213 Myopia, bilateral: Secondary | ICD-10-CM | POA: Diagnosis not present

## 2019-04-25 ENCOUNTER — Other Ambulatory Visit: Payer: Self-pay | Admitting: Family Medicine

## 2019-05-06 DIAGNOSIS — M25861 Other specified joint disorders, right knee: Secondary | ICD-10-CM | POA: Diagnosis not present

## 2019-05-06 DIAGNOSIS — M25561 Pain in right knee: Secondary | ICD-10-CM | POA: Diagnosis not present

## 2019-05-11 ENCOUNTER — Ambulatory Visit: Payer: 59 | Admitting: Family Medicine

## 2019-05-11 ENCOUNTER — Other Ambulatory Visit: Payer: Self-pay

## 2019-05-11 ENCOUNTER — Encounter: Payer: Self-pay | Admitting: Family Medicine

## 2019-05-11 VITALS — BP 118/78 | HR 84 | Temp 96.7°F | Ht 65.0 in | Wt 205.2 lb

## 2019-05-11 DIAGNOSIS — G43709 Chronic migraine without aura, not intractable, without status migrainosus: Secondary | ICD-10-CM | POA: Diagnosis not present

## 2019-05-11 DIAGNOSIS — N926 Irregular menstruation, unspecified: Secondary | ICD-10-CM | POA: Diagnosis not present

## 2019-05-11 DIAGNOSIS — K219 Gastro-esophageal reflux disease without esophagitis: Secondary | ICD-10-CM | POA: Diagnosis not present

## 2019-05-11 DIAGNOSIS — E669 Obesity, unspecified: Secondary | ICD-10-CM

## 2019-05-11 LAB — POCT URINE PREGNANCY: Preg Test, Ur: NEGATIVE

## 2019-05-11 MED ORDER — VENLAFAXINE HCL ER 37.5 MG PO CP24: 37.5000 mg | ORAL_CAPSULE | Freq: Every day | ORAL | 3 refills | Status: DC

## 2019-05-11 MED ORDER — OMEPRAZOLE 20 MG PO CPDR: 20.0000 mg | DELAYED_RELEASE_CAPSULE | Freq: Every day | ORAL | 0 refills | Status: DC

## 2019-05-11 NOTE — Assessment & Plan Note (Signed)
Check urine pregnancy test.  I encouraged her to contact her gynecologist to schedule follow-up to discuss her Nexplanon.

## 2019-05-11 NOTE — Progress Notes (Signed)
Tommi Rumps, MD Phone: [phone]  Valerie Lindsey is a 44 y.o. female who presents today for f/u.  GERD:   Reflux symptoms: none if taking medication   Abd pain: no   Blood in stool: no  Dysphagia: no   EGD: no  Medication: taking omeprazole  Migraines: Patient notes these have worsened.  They are more frequent occurring once weekly.  They are quite bothersome when they occur.  The Imitrex is helpful.  Propranolol did not make a difference.  No numbness or weakness.  No photophobia.  She wonders if it is related to her Nexplanon being due to be changed this summer.  Irregular menses: Patient notes recently she has had irregular menstrual cycles.  This time happened after 2 weeks.  Notes its close to time for her Nexplanon to be changed.   Social History   Tobacco Use  Smoking Status Never Smoker  Smokeless Tobacco Never Used     ROS see history of present illness  Objective  Physical Exam Vitals:   05/11/19 1530  BP: 118/78  Pulse: 84  Temp: (!) 96.7 F (35.9 C)  SpO2: 99%    BP Readings from Last 3 Encounters:  05/11/19 118/78  09/06/18 122/80  04/11/18 126/84   Wt Readings from Last 3 Encounters:  05/11/19 205 lb 3.2 oz (93.1 kg)  11/08/18 210 lb (95.3 kg)  09/06/18 210 lb 6.4 oz (95.4 kg)    Physical Exam Constitutional:      General: She is not in acute distress.    Appearance: She is not diaphoretic.  Cardiovascular:     Rate and Rhythm: Normal rate and regular rhythm.     Heart sounds: Normal heart sounds.  Pulmonary:     Effort: Pulmonary effort is normal.     Breath sounds: Normal breath sounds.  Abdominal:     General: Bowel sounds are normal. There is no distension.     Palpations: Abdomen is soft.     Tenderness: There is no abdominal tenderness. There is no guarding or rebound.  Skin:    General: Skin is warm and dry.  Neurological:     Mental Status: She is alert.     Comments: 5/5 strength in bilateral biceps, triceps, grip,  quads, hamstrings, plantar and dorsiflexion, sensation to light touch intact in bilateral UE and LE, normal gait      Assessment/Plan: Please see individual problem list.  Migraine Increased frequency of migraines that are problematic.  We will trial venlafaxine.  Pregnancy test obtained.  I did encourage her to contact her gynecologist to see if they would go ahead and change her Nexplanon as that could be contributing as well.  Obesity (BMI 30.0-34.9) Encouraged diet and exercise.  GERD (gastroesophageal reflux disease) We will trial decreasing omeprazole to 20 mg once daily.  If this is equally beneficial then she will contact us and we can refill for longer.  If it is not beneficial she will go back to the 40 mg dose.  Irregular menses Check urine pregnancy test.  I encouraged her to contact her gynecologist to schedule follow-up to discuss her Nexplanon.   Orders Placed This Encounter  Procedures  . POCT urine pregnancy    Meds ordered this encounter  Medications  . omeprazole (PRILOSEC) 20 MG capsule    Sig: Take 1 capsule (20 mg total) by mouth daily.    Dispense:  14 capsule    Refill:  0  . venlafaxine XR (EFFEXOR XR) 37.5 MG  24 hr capsule    Sig: Take 1 capsule (37.5 mg total) by mouth daily with breakfast.    Dispense:  30 capsule    Refill:  3    This visit occurred during the SARS-CoV-2 public health emergency.  Safety protocols were in place, including screening questions prior to the visit, additional usage of staff PPE, and extensive cleaning of exam room while observing appropriate contact time as indicated for disinfecting solutions.    Tommi Rumps, MD [place]

## 2019-05-11 NOTE — Patient Instructions (Addendum)
Nice to see you. Please try to decrease the omeprazole to 20 mg.  If it is not beneficial please go back to 40 mg. We will start you on venlafaxine for your migraines. Please contact your gynecologist for follow-up. Please work on diet and exercise.

## 2019-05-11 NOTE — Assessment & Plan Note (Signed)
Increased frequency of migraines that are problematic.  We will trial venlafaxine.  Pregnancy test obtained.  I did encourage her to contact her gynecologist to see if they would go ahead and change her Nexplanon as that could be contributing as well.

## 2019-05-11 NOTE — Assessment & Plan Note (Signed)
Encouraged diet and exercise.  

## 2019-05-11 NOTE — Assessment & Plan Note (Signed)
We will trial decreasing omeprazole to 20 mg once daily.  If this is equally beneficial then she will contact us and we can refill for longer.  If it is not beneficial she will go back to the 40 mg dose.

## 2019-07-13 ENCOUNTER — Other Ambulatory Visit: Payer: Self-pay

## 2019-07-13 ENCOUNTER — Encounter: Payer: Self-pay | Admitting: Family Medicine

## 2019-07-13 ENCOUNTER — Telehealth (INDEPENDENT_AMBULATORY_CARE_PROVIDER_SITE_OTHER): Payer: 59 | Admitting: Family Medicine

## 2019-07-13 DIAGNOSIS — K219 Gastro-esophageal reflux disease without esophagitis: Secondary | ICD-10-CM

## 2019-07-13 DIAGNOSIS — G43709 Chronic migraine without aura, not intractable, without status migrainosus: Secondary | ICD-10-CM

## 2019-07-13 MED ORDER — VENLAFAXINE HCL ER 37.5 MG PO CP24: 75.0000 mg | ORAL_CAPSULE | Freq: Every day | ORAL | 3 refills | Status: DC

## 2019-07-13 MED ORDER — OMEPRAZOLE 40 MG PO CPDR: 40.0000 mg | DELAYED_RELEASE_CAPSULE | Freq: Every day | ORAL | 1 refills | Status: DC

## 2019-07-13 NOTE — Assessment & Plan Note (Signed)
Has improved to a certain degree.  We will increase the Effexor to 75 mg once daily to see if that provides additional benefit.  She will try this for at least a month to get through her next menstrual cycle to see if there is benefit for those headaches.  If there is not any additional benefit we can decrease the dose back to 37.5 mg.  If she notices any side effects she will let us know.

## 2019-07-13 NOTE — Assessment & Plan Note (Signed)
She attempted to decrease the dose to have recurrent symptoms.  She will remain on 40 mg of omeprazole.

## 2019-07-13 NOTE — Progress Notes (Signed)
Virtual Visit via video Note  This visit type was conducted due to national recommendations for restrictions regarding the COVID-19 pandemic (e.g. social distancing).  This format is felt to be most appropriate for this patient at this time.  All issues noted in this document were discussed and addressed.  No physical exam was performed (except for noted visual exam findings with Video Visits).   I connected with Valerie Lindsey today at  8:00 AM EDT by a video enabled telemedicine application and verified that I am speaking with the correct person using two identifiers. Location patient: mother-in-laws home Location provider: work Persons participating in the virtual visit: patient, provider  I discussed the limitations, risks, security and privacy concerns of performing an evaluation and management service by telephone and the availability of in person appointments. I also discussed with the patient that there may be a patient responsible charge related to this service. The patient expressed understanding and agreed to proceed.  Reason for visit: f/u  HPI: Migraines: Patient notes the Effexor has been beneficial.  She does still get some migraines though the Tylenol helps resolve them pretty quickly since she has been on the Effexor.  She does get premenstrual migraines which the Tylenol does not help.  GERD: She tried decreasing the dose of omeprazole to 20 mg once daily and notes she did okay at first though subsequently she had worsening reflux and had to increase her dose back to 40 mg.  She notes no reflux symptoms on the 40 mg capsule.   ROS: See pertinent positives and negatives per HPI.  Past Medical History:  Diagnosis Date  . Amenorrhea   . Cholelithiasis   . GERD (gastroesophageal reflux disease)   . Migraine   . Ovarian cyst     Past Surgical History:  Procedure Laterality Date  . CHOLECYSTECTOMY  2014    Family History  Problem Relation Age of Onset  . Prostate  cancer Father   . Heart disease Father   . Hyperlipidemia Mother   . Heart disease Mother   . Hypertension Mother   . Stroke Paternal Grandmother   . Breast cancer Neg Hx     SOCIAL HX: Non-smoker   Current Outpatient Medications:  .  omeprazole (PRILOSEC) 40 MG capsule, Take 1 capsule (40 mg total) by mouth daily., Disp: 90 capsule, Rfl: 1 .  SUMAtriptan (IMITREX) 100 MG tablet, TAKE 1 TABLET AT START OF MIGRAINE. MAY HAVE SECOND DOSE IN 2 HOURS., Disp: 30 tablet, Rfl: 5 .  venlafaxine XR (EFFEXOR XR) 37.5 MG 24 hr capsule, Take 2 capsules (75 mg total) by mouth daily with breakfast., Disp: 60 capsule, Rfl: 3  EXAM:  VITALS per patient if applicable:  GENERAL: alert, oriented, appears well and in no acute distress  HEENT: atraumatic, conjunttiva clear, no obvious abnormalities on inspection of external nose and ears  NECK: normal movements of the head and neck  LUNGS: on inspection no signs of respiratory distress, breathing rate appears normal, no obvious gross SOB, gasping or wheezing  CV: no obvious cyanosis  MS: moves all visible extremities without noticeable abnormality  PSYCH/NEURO: pleasant and cooperative, no obvious depression or anxiety, speech and thought processing grossly intact  ASSESSMENT AND PLAN:  Discussed the following assessment and plan:  Migraine Has improved to a certain degree.  We will increase the Effexor to 75 mg once daily to see if that provides additional benefit.  She will try this for at least a month to get through  her next menstrual cycle to see if there is benefit for those headaches.  If there is not any additional benefit we can decrease the dose back to 37.5 mg.  If she notices any side effects she will let us know.  GERD (gastroesophageal reflux disease) She attempted to decrease the dose to have recurrent symptoms.  She will remain on 40 mg of omeprazole.   No orders of the defined types were placed in this encounter.   Meds  ordered this encounter  Medications  . venlafaxine XR (EFFEXOR XR) 37.5 MG 24 hr capsule    Sig: Take 2 capsules (75 mg total) by mouth daily with breakfast.    Dispense:  60 capsule    Refill:  3  . omeprazole (PRILOSEC) 40 MG capsule    Sig: Take 1 capsule (40 mg total) by mouth daily.    Dispense:  90 capsule    Refill:  1     I discussed the assessment and treatment plan with the patient. The patient was provided an opportunity to ask questions and all were answered. The patient agreed with the plan and demonstrated an understanding of the instructions.   The patient was advised to call back or seek an in-person evaluation if the symptoms worsen or if the condition fails to improve as anticipated.  Tommi Rumps, MD

## 2019-09-07 ENCOUNTER — Encounter: Payer: 59 | Admitting: Family Medicine

## 2019-09-27 ENCOUNTER — Encounter: Payer: 59 | Admitting: Family Medicine

## 2019-10-18 ENCOUNTER — Other Ambulatory Visit: Payer: Self-pay | Admitting: Family Medicine

## 2019-10-25 ENCOUNTER — Encounter: Payer: Self-pay | Admitting: Family Medicine

## 2019-10-25 ENCOUNTER — Other Ambulatory Visit: Payer: Self-pay

## 2019-10-25 ENCOUNTER — Telehealth (INDEPENDENT_AMBULATORY_CARE_PROVIDER_SITE_OTHER): Payer: 59 | Admitting: Family Medicine

## 2019-10-25 DIAGNOSIS — G43709 Chronic migraine without aura, not intractable, without status migrainosus: Secondary | ICD-10-CM

## 2019-10-25 NOTE — Assessment & Plan Note (Signed)
Very well controlled at this time.  She will continue Effexor 75 mg once daily.  She can continue the Imitrex as needed.  If she notices increasing sensations of feeling hot or if she develops hot flashes with the Effexor she will let us know.

## 2019-10-25 NOTE — Progress Notes (Signed)
Virtual Visit via video Note  This visit type was conducted due to national recommendations for restrictions regarding the COVID-19 pandemic (e.g. social distancing).  This format is felt to be most appropriate for this patient at this time.  All issues noted in this document were discussed and addressed.  No physical exam was performed (except for noted visual exam findings with Video Visits).   I connected with Valerie Lindsey today at  9:00 AM EDT by a video enabled telemedicine application and verified that I am speaking with the correct person using two identifiers. Location patient: home Location provider: work Persons participating in the virtual visit: patient, provider  I discussed the limitations, risks, security and privacy concerns of performing an evaluation and management service by telephone and the availability of in person appointments. I also discussed with the patient that there may be a patient responsible charge related to this service. The patient expressed understanding and agreed to proceed.  Reason for visit: f/u  HPI: Migraines: Patient notes these are much improved.  Only had one at the start of one menstrual cycle since her last visit.  She took 1 Imitrex and it resolved.  She is doing the Effexor 75 mg once daily.  No numbness weakness,, or vision changes.  She notes occasionally feeling hot with the Effexor.  She does feel as though the Nexplanon that she has in place was contributing to her headaches as she was having more frequent menstrual cycles with this though she thinks the medication in this has worn out and she is going to discuss alternatives with her GYN.  She also reports that she is not been as hot headed since starting on the Effexor so that is a good benefit as well.   ROS: See pertinent positives and negatives per HPI.  Past Medical History:  Diagnosis Date  . Amenorrhea   . Cholelithiasis   . GERD (gastroesophageal reflux disease)   . Migraine    . Ovarian cyst     Past Surgical History:  Procedure Laterality Date  . CHOLECYSTECTOMY  2014    Family History  Problem Relation Age of Onset  . Prostate cancer Father   . Heart disease Father   . Hyperlipidemia Mother   . Heart disease Mother   . Hypertension Mother   . Stroke Paternal Grandmother   . Breast cancer Neg Hx     SOCIAL HX: Non-smoker   Current Outpatient Medications:  .  acetaminophen (TYLENOL) 325 MG tablet, Take 650 mg by mouth every 6 (six) hours as needed., Disp: , Rfl:  .  naproxen sodium (ALEVE) 220 MG tablet, Take 220 mg by mouth., Disp: , Rfl:  .  omeprazole (PRILOSEC) 40 MG capsule, Take 1 capsule (40 mg total) by mouth daily., Disp: 90 capsule, Rfl: 1 .  SUMAtriptan (IMITREX) 100 MG tablet, TAKE 1 TABLET AT START OF MIGRAINE. MAY HAVE SECOND DOSE IN 2 HOURS., Disp: 9 tablet, Rfl: 5 .  venlafaxine XR (EFFEXOR XR) 37.5 MG 24 hr capsule, Take 2 capsules (75 mg total) by mouth daily with breakfast., Disp: 60 capsule, Rfl: 3  EXAM:  VITALS per patient if applicable:  GENERAL: alert, oriented, appears well and in no acute distress  HEENT: atraumatic, conjunttiva clear, no obvious abnormalities on inspection of external nose and ears  NECK: normal movements of the head and neck  LUNGS: on inspection no signs of respiratory distress, breathing rate appears normal, no obvious gross SOB, gasping or wheezing  CV: no  obvious cyanosis  MS: moves all visible extremities without noticeable abnormality  PSYCH/NEURO: pleasant and cooperative, no obvious depression or anxiety, speech and thought processing grossly intact  ASSESSMENT AND PLAN:  Discussed the following assessment and plan:  Migraine Very well controlled at this time.  She will continue Effexor 75 mg once daily.  She can continue the Imitrex as needed.  If she notices increasing sensations of feeling hot or if she develops hot flashes with the Effexor she will let us know.   No orders of  the defined types were placed in this encounter.   No orders of the defined types were placed in this encounter.    I discussed the assessment and treatment plan with the patient. The patient was provided an opportunity to ask questions and all were answered. The patient agreed with the plan and demonstrated an understanding of the instructions.   The patient was advised to call back or seek an in-person evaluation if the symptoms worsen or if the condition fails to improve as anticipated.    Tommi Rumps, MD

## 2019-11-24 ENCOUNTER — Other Ambulatory Visit: Payer: Self-pay | Admitting: Family Medicine

## 2019-11-25 ENCOUNTER — Encounter: Payer: Self-pay | Admitting: Family Medicine

## 2019-11-25 ENCOUNTER — Other Ambulatory Visit: Payer: Self-pay

## 2019-11-25 ENCOUNTER — Ambulatory Visit (INDEPENDENT_AMBULATORY_CARE_PROVIDER_SITE_OTHER): Payer: 59 | Admitting: Family Medicine

## 2019-11-25 VITALS — BP 118/80 | HR 93 | Temp 97.9°F | Ht 65.0 in | Wt 210.4 lb

## 2019-11-25 DIAGNOSIS — Z13220 Encounter for screening for lipoid disorders: Secondary | ICD-10-CM

## 2019-11-25 DIAGNOSIS — E669 Obesity, unspecified: Secondary | ICD-10-CM

## 2019-11-25 DIAGNOSIS — Z862 Personal history of diseases of the blood and blood-forming organs and certain disorders involving the immune mechanism: Secondary | ICD-10-CM

## 2019-11-25 DIAGNOSIS — Z0000 Encounter for general adult medical examination without abnormal findings: Secondary | ICD-10-CM

## 2019-11-25 DIAGNOSIS — Z1231 Encounter for screening mammogram for malignant neoplasm of breast: Secondary | ICD-10-CM | POA: Diagnosis not present

## 2019-11-25 DIAGNOSIS — E66811 Obesity, class 1: Secondary | ICD-10-CM

## 2019-11-25 LAB — LIPID PANEL
Cholesterol: 193 mg/dL (ref 0–200)
HDL: 47.7 mg/dL (ref >39.00)
LDL Cholesterol: 118 mg/dL — ABNORMAL HIGH (ref 0–99)
NonHDL: 144.98
Total CHOL/HDL Ratio: 4
Triglycerides: 133 mg/dL (ref 0.0–149.0)
VLDL: 26.6 mg/dL (ref 0.0–40.0)

## 2019-11-25 LAB — CBC WITH DIFFERENTIAL/PLATELET
Basophils Absolute: 0 10*3/uL (ref 0.0–0.1)
Basophils Relative: 0.6 % (ref 0.0–3.0)
Eosinophils Absolute: 0.1 10*3/uL (ref 0.0–0.7)
Eosinophils Relative: 1.6 % (ref 0.0–5.0)
HCT: 36 % (ref 36.0–46.0)
Hemoglobin: 12.2 g/dL (ref 12.0–15.0)
Lymphocytes Relative: 24.8 % (ref 12.0–46.0)
Lymphs Abs: 1.7 10*3/uL (ref 0.7–4.0)
MCHC: 33.9 g/dL (ref 30.0–36.0)
MCV: 80.1 fl (ref 78.0–100.0)
Monocytes Absolute: 0.4 10*3/uL (ref 0.1–1.0)
Monocytes Relative: 6.1 % (ref 3.0–12.0)
Neutro Abs: 4.7 10*3/uL (ref 1.4–7.7)
Neutrophils Relative %: 66.9 % (ref 43.0–77.0)
Platelets: 256 10*3/uL (ref 150.0–400.0)
RBC: 4.49 Mil/uL (ref 3.87–5.11)
RDW: 14.9 % (ref 11.5–15.5)
WBC: 7 10*3/uL (ref 4.0–10.5)

## 2019-11-25 LAB — COMPREHENSIVE METABOLIC PANEL
ALT: 16 U/L (ref 0–35)
AST: 17 U/L (ref 0–37)
Albumin: 4.2 g/dL (ref 3.5–5.2)
Alkaline Phosphatase: 62 U/L (ref 39–117)
BUN: 11 mg/dL (ref 6–23)
CO2: 26 mEq/L (ref 19–32)
Calcium: 9.2 mg/dL (ref 8.4–10.5)
Chloride: 102 mEq/L (ref 96–112)
Creatinine, Ser: 0.72 mg/dL (ref 0.40–1.20)
GFR: 101.81 mL/min (ref >60.00)
Glucose, Bld: 80 mg/dL (ref 70–99)
Potassium: 3.9 mEq/L (ref 3.5–5.1)
Sodium: 136 mEq/L (ref 135–145)
Total Bilirubin: 0.3 mg/dL (ref 0.2–1.2)
Total Protein: 7 g/dL (ref 6.0–8.3)

## 2019-11-25 LAB — HEMOGLOBIN A1C: Hgb A1c MFr Bld: 5.7 % (ref 4.6–6.5)

## 2019-11-25 NOTE — Progress Notes (Signed)
Tommi Rumps, MD Phone: [phone]  Valerie Lindsey is a 44 y.o. female who presents today for CPE.  Diet: Patient notes she is been eating more carbs than she should.  She has had some decreased appetite with the Effexor.  She wants to work on a [employer]. Exercise: Patient notes she just got a puppy and is going to start walking the puppy for exercise. Pap smear: Due Colonoscopy: Not indicated Mammogram: Due Family history-  Colon cancer: No   Breast cancer: No  Ovarian cancer: No Menses: Once monthly lasting 3 to 4 days with normal flow Vaccines-   Flu: Up-to-date  Tetanus: Up-to-date  COVID19: Up-to-date HIV screening: Recently donated blood Hep C Screening: Recently donated blood Tobacco use: No Alcohol use: No Illicit Drug use: No Dentist: Yes Ophthalmology: Yes  Reports that she has herniated fat beside her right eye.  Notes ophthalmology has waited on this.   Active Ambulatory Problems    Diagnosis Date Noted  . Routine general medical examination at a health care facility 06/11/2015  . Migraine 06/11/2015  . Obesity (BMI 30.0-34.9) 06/14/2017  . History of anemia 06/14/2017  . GERD (gastroesophageal reflux disease) 05/11/2019  . Irregular menses 05/11/2019   Resolved Ambulatory Problems    Diagnosis Date Noted  . Overweight (BMI 25.0-29.9) 11/21/2016   Past Medical History:  Diagnosis Date  . Amenorrhea   . Cholelithiasis   . Ovarian cyst     Family History  Problem Relation Age of Onset  . Prostate cancer Father   . Heart disease Father   . Hyperlipidemia Mother   . Heart disease Mother   . Hypertension Mother   . Stroke Paternal Grandmother   . Breast cancer Neg Hx     Social History   Socioeconomic History  . Marital status: Married    Spouse name: Not on file  . Number of children: Not on file  . Years of education: Not on file  . Highest education level: Not on file  Occupational History  . Not on file  Tobacco Use  .  Smoking status: Never Smoker  . Smokeless tobacco: Never Used  Vaping Use  . Vaping Use: Never used  Substance and Sexual Activity  . Alcohol use: No    Alcohol/week: 0.0 standard drinks  . Drug use: No  . Sexual activity: Yes    Birth control/protection: None  Other Topics Concern  . Not on file  Social History Narrative  . Not on file   Social Determinants of Health   Financial Resource Strain:   . Difficulty of Paying Living Expenses: Not on file  Food Insecurity:   . Worried About Charity fundraiser in the Last Year: Not on file  . Ran Out of Food in the Last Year: Not on file  Transportation Needs:   . Lack of Transportation (Medical): Not on file  . Lack of Transportation (Non-Medical): Not on file  Physical Activity:   . Days of Exercise per Week: Not on file  . Minutes of Exercise per Session: Not on file  Stress:   . Feeling of Stress : Not on file  Social Connections:   . Frequency of Communication with Friends and Family: Not on file  . Frequency of Social Gatherings with Friends and Family: Not on file  . Attends Religious Services: Not on file  . Active Member of Clubs or Organizations: Not on file  . Attends Archivist Meetings: Not on file  .  Marital Status: Not on file  Intimate Partner Violence:   . Fear of Current or Ex-Partner: Not on file  . Emotionally Abused: Not on file  . Physically Abused: Not on file  . Sexually Abused: Not on file    ROS  General:  Negative for nexplained weight loss, fever Skin: Negative for new or changing mole, sore that won't heal HEENT: Negative for trouble hearing, trouble seeing, ringing in ears, mouth sores, hoarseness, change in voice, dysphagia. CV:  Negative for chest pain, dyspnea, edema, palpitations Resp: Negative for cough, dyspnea, hemoptysis GI: Negative for nausea, vomiting, diarrhea, constipation, abdominal pain, melena, hematochezia. GU: Negative for dysuria, incontinence, urinary hesitance,  hematuria, vaginal or penile discharge, polyuria, sexual difficulty, lumps in testicle or breasts MSK: Negative for muscle cramps or aches, joint pain or swelling Neuro: Negative for headaches, weakness, numbness, dizziness, passing out/fainting Psych: Negative for depression, anxiety, memory problems  Objective  Physical Exam Vitals:   11/25/19 1330  BP: 118/80  Pulse: 93  Temp: 97.9 F (36.6 C)  SpO2: 97%    BP Readings from Last 3 Encounters:  11/25/19 118/80  05/11/19 118/78  09/06/18 122/80   Wt Readings from Last 3 Encounters:  11/25/19 210 lb 6.4 oz (95.4 kg)  10/25/19 208 lb (94.3 kg)  07/13/19 205 lb (93 kg)    Physical Exam Constitutional:      General: She is not in acute distress.    Appearance: She is not diaphoretic.  HENT:     Head: Normocephalic and atraumatic.  Eyes:     Conjunctiva/sclera: Conjunctivae normal.     Pupils: Pupils are equal, round, and reactive to light.   Cardiovascular:     Rate and Rhythm: Normal rate and regular rhythm.     Heart sounds: Normal heart sounds.  Pulmonary:     Effort: Pulmonary effort is normal.     Breath sounds: Normal breath sounds.  Abdominal:     General: Bowel sounds are normal. There is no distension.     Palpations: Abdomen is soft.     Tenderness: There is no guarding or rebound.     Comments: Slight discomfort on left lower quadrant palpation  Musculoskeletal:     Right lower leg: No edema.     Left lower leg: No edema.  Skin:    General: Skin is warm and dry.  Neurological:     Mental Status: She is alert.  Psychiatric:        Mood and Affect: Mood normal.      Assessment/Plan:   Problem List Items Addressed This Visit    History of anemia   Relevant Orders   CBC w/Diff   Obesity (BMI 30.0-34.9)   Relevant Orders   HgB A1c   Routine general medical examination at a health care facility - Primary    Physical exam completed.  Encouraged low carbohydrate diet.  Discussed increasing  exercise with walking with her dog.  I advised that she is due for a Pap smear and she is going to contact her GYN to get this scheduled.  Pelvic exam and breast exam deferred to GYN.  She also needs her Nexplanon removed and she wants to discuss additional birth control options with them.  Mammogram is due and has been ordered.  Patient will call to schedule.  Hepatitis C and HIV screening completed through recent blood donation.  Vaccines up-to-date.  Labs as outlined below.  The patient did have slight discomfort on left lower quadrant  palpation though she has noted no abdominal pain.  Potentially could be related to gas or intestinal irritation.  We will check labs to rule out underlying cause.       Other Visit Diagnoses    Lipid screening       Relevant Orders   Comp Met (CMET)   Lipid panel   Encounter for screening mammogram for malignant neoplasm of breast       Relevant Orders   MM 3D SCREEN BREAST BILATERAL      This visit occurred during the SARS-CoV-2 public health emergency.  Safety protocols were in place, including screening questions prior to the visit, additional usage of staff PPE, and extensive cleaning of exam room while observing appropriate contact time as indicated for disinfecting solutions.    Tommi Rumps, MD [place]

## 2019-11-25 NOTE — Assessment & Plan Note (Addendum)
Physical exam completed.  Encouraged low carbohydrate diet.  Discussed increasing exercise with walking with her dog.  I advised that she is due for a Pap smear and she is going to contact her GYN to get this scheduled.  Pelvic exam and breast exam deferred to GYN.  She also needs her Nexplanon removed and she wants to discuss additional birth control options with them.  Mammogram is due and has been ordered.  Patient will call to schedule.  Hepatitis C and HIV screening completed through recent blood donation.  Vaccines up-to-date.  Labs as outlined below.  The patient did have slight discomfort on left lower quadrant palpation though she has noted no abdominal pain.  Potentially could be related to gas or intestinal irritation.  We will check labs to rule out underlying cause.

## 2019-11-25 NOTE — Patient Instructions (Addendum)
Nice to see you. Please add in walking for exercise. Please work on low carbohydrate diet. Please call your gynecologist as you are due for a pelvic exam and Pap smear. If you start to have abdominal pain please let us know.

## 2019-12-29 ENCOUNTER — Encounter: Payer: Self-pay | Admitting: Family Medicine

## 2020-01-11 ENCOUNTER — Other Ambulatory Visit: Payer: Self-pay | Admitting: Family Medicine

## 2020-03-05 ENCOUNTER — Other Ambulatory Visit: Payer: Self-pay

## 2020-03-05 ENCOUNTER — Ambulatory Visit
Admission: RE | Admit: 2020-03-05 | Discharge: 2020-03-05 | Disposition: A | Discharge status: Home / Self Care | Payer: 59 | Source: Ambulatory Visit | Attending: Family Medicine | Admitting: Family Medicine

## 2020-03-05 DIAGNOSIS — Z1231 Encounter for screening mammogram for malignant neoplasm of breast: Secondary | ICD-10-CM | POA: Diagnosis not present

## 2020-03-21 ENCOUNTER — Other Ambulatory Visit: Payer: Self-pay | Admitting: Family Medicine

## 2020-04-05 ENCOUNTER — Emergency Department
Admission: EM | Admit: 2020-04-05 | Discharge: 2020-04-06 | Disposition: A | Discharge status: Home / Self Care | Payer: 59 | Source: Home / Self Care | Attending: Student in an Organized Health Care Education/Training Program | Admitting: Student in an Organized Health Care Education/Training Program

## 2020-04-05 ENCOUNTER — Other Ambulatory Visit: Payer: Self-pay

## 2020-04-05 ENCOUNTER — Emergency Department: Payer: 59

## 2020-04-05 DIAGNOSIS — S82851A Displaced trimalleolar fracture of right lower leg, initial encounter for closed fracture: Secondary | ICD-10-CM | POA: Insufficient documentation

## 2020-04-05 DIAGNOSIS — X501XXA Overexertion from prolonged static or awkward postures, initial encounter: Secondary | ICD-10-CM | POA: Insufficient documentation

## 2020-04-05 DIAGNOSIS — M7989 Other specified soft tissue disorders: Secondary | ICD-10-CM | POA: Diagnosis not present

## 2020-04-05 DIAGNOSIS — S82891A Other fracture of right lower leg, initial encounter for closed fracture: Secondary | ICD-10-CM

## 2020-04-05 DIAGNOSIS — Z79899 Other long term (current) drug therapy: Secondary | ICD-10-CM | POA: Diagnosis not present

## 2020-04-05 DIAGNOSIS — W19XXXA Unspecified fall, initial encounter: Secondary | ICD-10-CM | POA: Diagnosis not present

## 2020-04-05 DIAGNOSIS — S8251XA Displaced fracture of medial malleolus of right tibia, initial encounter for closed fracture: Secondary | ICD-10-CM | POA: Diagnosis not present

## 2020-04-05 DIAGNOSIS — Y9351 Activity, roller skating (inline) and skateboarding: Secondary | ICD-10-CM | POA: Insufficient documentation

## 2020-04-05 DIAGNOSIS — M25471 Effusion, right ankle: Secondary | ICD-10-CM | POA: Diagnosis not present

## 2020-04-05 DIAGNOSIS — R0689 Other abnormalities of breathing: Secondary | ICD-10-CM | POA: Diagnosis not present

## 2020-04-05 DIAGNOSIS — Z20822 Contact with and (suspected) exposure to covid-19: Secondary | ICD-10-CM | POA: Diagnosis not present

## 2020-04-05 MED ORDER — ONDANSETRON HCL 4 MG/2ML IJ SOLN
4.0000 mg | Freq: Once | INTRAMUSCULAR | Status: AC
  Administered 2020-04-06: 4 mg via INTRAVENOUS
  Filled 2020-04-05: qty 2

## 2020-04-05 MED ORDER — HYDROMORPHONE HCL 1 MG/ML IJ SOLN
1.0000 mg | Freq: Once | INTRAMUSCULAR | Status: AC
  Administered 2020-04-05: 1 mg via INTRAVENOUS
  Filled 2020-04-05: qty 1

## 2020-04-05 MED ORDER — KETAMINE HCL 10 MG/ML IJ SOLN
1.0000 mg/kg | Freq: Once | INTRAMUSCULAR | Status: AC
  Administered 2020-04-06: 50 mg via INTRAVENOUS
  Filled 2020-04-05: qty 1

## 2020-04-05 MED ORDER — PROPOFOL 10 MG/ML IV BOLUS
0.5000 mg/kg | Freq: Once | INTRAVENOUS | Status: AC
  Administered 2020-04-06: 50 mg via INTRAVENOUS
  Filled 2020-04-05: qty 20

## 2020-04-05 NOTE — ED Notes (Signed)
Patient in severe pain during imaging. Unable to tolerate. Provider notified and medication ordered.

## 2020-04-05 NOTE — ED Triage Notes (Signed)
Pt is a 45 y/o f who presents via EMS with a cc of R ankle injury after tripping while roller skating just PTA. Patient fell while dancing on skates and reports "my ankle was turned out the wrong way". R ankle deformity noted. Denies other injury. 75 mcg of fentanyl administered with mild relief. Ankle splint in place from medic.

## 2020-04-05 NOTE — ED Notes (Signed)
ED Provider at bedside. 

## 2020-04-05 NOTE — ED Provider Notes (Signed)
[facility] Emergency Department Provider Note  ____________________________________________   Event Date/Time   First MD Initiated Contact with Patient 04/05/20 2140     (approximate)  I have reviewed the triage vital signs and the nursing notes.   HISTORY  Chief Complaint Ankle Injury  HPI Valerie Lindsey is a 45 y.o. female who reports to the emergency department via EMS for evaluation of right ankle injury.  Patient states that she was rollerskating with her daughter, doing the hokey pokey dance, and she was trying to support her daughter who is also skating when she twisted her right ankle and foot and fell to the ground.  She states she had immediate pain and noted deformity of her right ankle and was brought in by EMS.         Past Medical History:  Diagnosis Date   Amenorrhea    Cholelithiasis    GERD (gastroesophageal reflux disease)    Migraine    Ovarian cyst     Patient Active Problem List   Diagnosis Date Noted   GERD (gastroesophageal reflux disease) 05/11/2019   Irregular menses 05/11/2019   Obesity (BMI 30.0-34.9) 06/14/2017   History of anemia 06/14/2017   Routine general medical examination at a health care facility 06/11/2015   Migraine 06/11/2015    Past Surgical History:  Procedure Laterality Date   CHOLECYSTECTOMY  2014    Prior to Admission medications   Medication Sig Start Date End Date Taking? Authorizing Provider  acetaminophen (TYLENOL) 325 MG tablet Take 650 mg by mouth every 6 (six) hours as needed.    [provider]  ibuprofen (ADVIL) 600 MG tablet Take 600 mg by mouth every 6 (six) hours as needed.    [provider]  naproxen sodium (ALEVE) 220 MG tablet Take 220 mg by mouth. Patient not taking: Reported on 11/25/2019    [provider]  omeprazole (PRILOSEC) 40 MG capsule TAKE 1 CAPSULE (40 MG TOTAL) BY MOUTH DAILY. 01/11/20   Leone Haven, MD  SUMAtriptan  (IMITREX) 100 MG tablet TAKE 1 TABLET AT START OF MIGRAINE. MAY HAVE SECOND DOSE IN 2 HOURS. 10/18/19   Leone Haven, MD  venlafaxine XR (EFFEXOR-XR) 37.5 MG 24 hr capsule TAKE 2 CAPSULES (75 MG) BY MOUTH DAILY WITH BREAKFAST. 03/21/20   Leone Haven, MD    Allergies Patient has no known allergies.  Family History  Problem Relation Age of Onset   Prostate cancer Father    Heart disease Father    Hyperlipidemia Mother    Heart disease Mother    Hypertension Mother    Stroke Paternal Grandmother    Breast cancer Neg Hx     Social History Social History   Tobacco Use   Smoking status: Never Smoker   Smokeless tobacco: Never Used  Scientific laboratory technician Use: Never used  Substance Use Topics   Alcohol use: No    Alcohol/week: 0.0 standard drinks   Drug use: No    Review of Systems Constitutional: No fever/chills Eyes: No visual changes. ENT: No sore throat. Cardiovascular: Denies chest pain. Respiratory: Denies shortness of breath. Gastrointestinal: No abdominal pain.  No nausea, no vomiting.  No diarrhea.  No constipation. Genitourinary: Negative for dysuria. Musculoskeletal: + Right ankle pain, negative for back pain. Skin: Negative for rash. Neurological: Negative for headaches, focal weakness or numbness.  ____________________________________________   PHYSICAL EXAM:  VITAL SIGNS: ED Triage Vitals  Enc Vitals Group  BP 04/05/20 2046 134/84     Pulse Rate 04/05/20 2046 63     Resp 04/05/20 2046 18     Temp 04/05/20 2046 97.9 F (36.6 C)     Temp Source 04/05/20 2046 Oral     SpO2 04/05/20 2044 98 %     Weight 04/05/20 2047 225 lb 1.6 oz (102.1 kg)     Height 04/05/20 2047 5\' 6"  (1.676 m)     Head Circumference --      Peak Flow --      Pain Score 04/05/20 2046 9     Pain Loc --      Pain Edu? --      Excl. in [place]? --    Constitutional: Alert and oriented. Well appearing and in no acute distress. Eyes: Conjunctivae are normal.  PERRL. EOMI. Head: Atraumatic. Cardiovascular: Normal rate, regular rhythm. Grossly normal heart sounds.  Good peripheral circulation. Respiratory: Normal respiratory effort.  No retractions. Lungs CTAB. Musculoskeletal: There is obvious deformity about the right ankle with ecchymosis and swelling over the medial aspect.  Dorsal pedal pulses 2+, capillary refill less than 3 seconds.  The extremity feels normal temperature. Neurologic:  Normal speech and language. No gross focal neurologic deficits are appreciated.  Gait not assessed secondary to right lower extremity injury. Skin:  Skin is warm, dry and intact. No rash noted. Psychiatric: Mood and affect are normal. Speech and behavior are normal.  ____________________________________________  RADIOLOGY I, Marlana Salvage, personally viewed and evaluated these images (plain radiographs) as part of my medical decision making, as well as reviewing the written report by the radiologist.  ED provider interpretation: X-ray of the right ankle demonstrates multiple fractures including medial and lateral malleoli and probable posterior tibial component, making this likely a trimalleolar fracture  Official radiology report(s): DG Ankle Complete Right  Result Date: 04/05/2020 CLINICAL DATA:  Fall, ankle injury history of fall while roller-skating. EXAM: RIGHT ANKLE - COMPLETE 3+ VIEW; RIGHT FOOT COMPLETE - 3+ VIEW COMPARISON:  None FINDINGS: RIGHT ankle: Fracture dislocation of the RIGHT ankle with fractures of the lateral malleolus, oblique fracture with marked lateral displacement. Lateral translation of the talus with comminuted fracture of the medial malleolus translated laterally as well. Bony detail of the talus limited by overlying casting material. Potential fracture involving the posterior tibia though this is not definite. RIGHT foot: Fractures about the ankle are also noted on imaging of the RIGHT foot. Soft tissue swelling about the RIGHT  lower extremity. Phalangeal assessment limited by overlap. IMPRESSION: Fracture dislocation about the RIGHT ankle with marked lateral translation of the talus, medial and lateral malleolus and potentially associated posterior malleolar trimalleolar fracture, potentially involving the posterolateral corner of the distal tibia. Electronically Signed   By: Zetta Bills M.D.   On: 04/05/2020 21:59   DG Foot Complete Right  Result Date: 04/05/2020 CLINICAL DATA:  Fall, ankle injury history of fall while roller-skating. EXAM: RIGHT ANKLE - COMPLETE 3+ VIEW; RIGHT FOOT COMPLETE - 3+ VIEW COMPARISON:  None FINDINGS: RIGHT ankle: Fracture dislocation of the RIGHT ankle with fractures of the lateral malleolus, oblique fracture with marked lateral displacement. Lateral translation of the talus with comminuted fracture of the medial malleolus translated laterally as well. Bony detail of the talus limited by overlying casting material. Potential fracture involving the posterior tibia though this is not definite. RIGHT foot: Fractures about the ankle are also noted on imaging of the RIGHT foot. Soft tissue swelling about the RIGHT lower extremity.  Phalangeal assessment limited by overlap. IMPRESSION: Fracture dislocation about the RIGHT ankle with marked lateral translation of the talus, medial and lateral malleolus and potentially associated posterior malleolar trimalleolar fracture, potentially involving the posterolateral corner of the distal tibia. Electronically Signed   By: Zetta Bills M.D.   On: 04/05/2020 21:59     ____________________________________________   INITIAL IMPRESSION / ASSESSMENT AND PLAN / ED COURSE  As part of my medical decision making, I reviewed the following data within the [facility] notes reviewed and incorporated, and Notes from prior ED visits        Patient is a 45 year old female who presents to the emergency department after mechanical fall while  rollerskating with right lower extremity injury.  See HPI for further details.  On physical exam, the patient has obvious deformity, however she remains neurovascularly intact.  Further assessment including range of motion and strength not assessed secondary to likely fracture.  X-rays were obtained and demonstrates a probable trimalleolar fracture.  The patient was then transferred to the main side of the emergency department for likely needing reduction under sedation.  Case was then transferred to Dr. Quentin Cornwall, who can perform more advanced care on the main side of the emergency department.      ____________________________________________   FINAL CLINICAL IMPRESSION(S) / ED DIAGNOSES  Final diagnoses:  Closed fracture of right ankle, initial encounter     ED Discharge Orders    None      *Please note:  Valerie Lindsey was evaluated in Emergency Department on 04/06/2020 for the symptoms described in the history of present illness. She was evaluated in the context of the global COVID-19 pandemic, which necessitated consideration that the patient might be at risk for infection with the SARS-CoV-2 virus that causes COVID-19. Institutional protocols and algorithms that pertain to the evaluation of patients at risk for COVID-19 are in a state of rapid change based on information released by regulatory bodies including the CDC and federal and state organizations. These policies and algorithms were followed during the patient's care in the ED.  Some ED evaluations and interventions may be delayed as a result of limited staffing during and the pandemic.*   Note:  This document was prepared using Dragon voice recognition software and may include unintentional dictation errors.   Marlana Salvage, PA 04/06/20 0018    Merlyn Lot, MD 04/06/20 [phone]

## 2020-04-05 NOTE — ED Notes (Signed)
X-ray at bedside

## 2020-04-05 NOTE — ED Provider Notes (Incomplete)
[facility] Emergency Department Provider Note  ____________________________________________   Event Date/Time   First MD Initiated Contact with Patient 04/05/20 2140     (approximate)  I have reviewed the triage vital signs and the nursing notes.   HISTORY  Chief Complaint Ankle Injury  HPI Valerie Lindsey is a 45 y.o. female who reports to the emergency department via EMS for evaluation of right ankle injury.  Patient states that she was rollerskating with her daughter, doing the hokey pokey dance, and she was trying to support her daughter who is also skating when she twisted her right ankle and foot        {**SYMPTOM/COMPLAINT  LOCATION (describe anatomically) DURATION (when did it start) TIMING (onset and pattern) SEVERITY (0-10, mild/moderate/severe) QUALITY (description of symptoms) CONTEXT (recent surgery, new meds, activity, etc.) MODIFYINGFACTORS (what makes it better/worse) ASSOCIATEDSYMPTOMS (pertinent positives and negatives)**} Past Medical History:  Diagnosis Date  . Amenorrhea   . Cholelithiasis   . GERD (gastroesophageal reflux disease)   . Migraine   . Ovarian cyst     Patient Active Problem List   Diagnosis Date Noted  . GERD (gastroesophageal reflux disease) 05/11/2019  . Irregular menses 05/11/2019  . Obesity (BMI 30.0-34.9) 06/14/2017  . History of anemia 06/14/2017  . Routine general medical examination at a health care facility 06/11/2015  . Migraine 06/11/2015    Past Surgical History:  Procedure Laterality Date  . CHOLECYSTECTOMY  2014    Prior to Admission medications   Medication Sig Start Date End Date Taking? Authorizing Provider  acetaminophen (TYLENOL) 325 MG tablet Take 650 mg by mouth every 6 (six) hours as needed.    [provider]  ibuprofen (ADVIL) 600 MG tablet Take 600 mg by mouth every 6 (six) hours as needed.    [provider]  naproxen sodium (ALEVE) 220 MG tablet Take  220 mg by mouth. Patient not taking: Reported on 11/25/2019    [provider]  omeprazole (PRILOSEC) 40 MG capsule TAKE 1 CAPSULE (40 MG TOTAL) BY MOUTH DAILY. 01/11/20   Leone Haven, MD  SUMAtriptan (IMITREX) 100 MG tablet TAKE 1 TABLET AT START OF MIGRAINE. MAY HAVE SECOND DOSE IN 2 HOURS. 10/18/19   Leone Haven, MD  venlafaxine XR (EFFEXOR-XR) 37.5 MG 24 hr capsule TAKE 2 CAPSULES (75 MG) BY MOUTH DAILY WITH BREAKFAST. 03/21/20   Leone Haven, MD    Allergies Patient has no known allergies.  Family History  Problem Relation Age of Onset  . Prostate cancer Father   . Heart disease Father   . Hyperlipidemia Mother   . Heart disease Mother   . Hypertension Mother   . Stroke Paternal Grandmother   . Breast cancer Neg Hx     Social History Social History   Tobacco Use  . Smoking status: Never Smoker  . Smokeless tobacco: Never Used  Vaping Use  . Vaping Use: Never used  Substance Use Topics  . Alcohol use: No    Alcohol/week: 0.0 standard drinks  . Drug use: No    Review of Systems {** Revise as appropriate then delete this line - Documentation of 10 systems is required  **} Constitutional: No fever/chills Eyes: No visual changes. ENT: No sore throat. Cardiovascular: Denies chest pain. Respiratory: Denies shortness of breath. Gastrointestinal: No abdominal pain.  No nausea, no vomiting.  No diarrhea.  No constipation. Genitourinary: Negative for dysuria. Musculoskeletal: Negative for back pain. Skin: Negative for rash. Neurological: Negative  for headaches, focal weakness or numbness. {**Psychiatric:  Endocrine:  Hematological/Lymphatic:  Allergic/Immunilogical: **}  ____________________________________________   PHYSICAL EXAM:  VITAL SIGNS: ED Triage Vitals  Enc Vitals Group     BP 04/05/20 2046 134/84     Pulse Rate 04/05/20 2046 63     Resp 04/05/20 2046 18     Temp 04/05/20 2046 97.9 F (36.6 C)     Temp Source 04/05/20  2046 Oral     SpO2 04/05/20 2044 98 %     Weight 04/05/20 2047 225 lb 1.6 oz (102.1 kg)     Height 04/05/20 2047 5\' 6"  (1.676 m)     Head Circumference --      Peak Flow --      Pain Score 04/05/20 2046 9     Pain Loc --      Pain Edu? --      Excl. in [place]? --    {** Revise as appropriate then delete this line - 8 systems required **} Constitutional: Alert and oriented. Well appearing and in no acute distress. Eyes: Conjunctivae are normal. PERRL. EOMI. Head: Atraumatic. Nose: No congestion/rhinnorhea. Mouth/Throat: Mucous membranes are moist.  Oropharynx non-erythematous. Neck: No stridor.  {**No cervical spine tenderness to palpation.**} {**Hematological/Lymphatic/Immunilogical: No cervical lymphadenopathy. **}Cardiovascular: Normal rate, regular rhythm. Grossly normal heart sounds.  Good peripheral circulation. Respiratory: Normal respiratory effort.  No retractions. Lungs CTAB. Gastrointestinal: Soft and nontender. No distention. No abdominal bruits. No CVA tenderness. {**Genitourinary:  **}Musculoskeletal: No lower extremity tenderness nor edema.  No joint effusions. Neurologic:  Normal speech and language. No gross focal neurologic deficits are appreciated. No gait instability. Skin:  Skin is warm, dry and intact. No rash noted. Psychiatric: Mood and affect are normal. Speech and behavior are normal.  ____________________________________________   LABS (all labs ordered are listed, but only abnormal results are displayed)  Labs Reviewed - No data to display ____________________________________________  EKG  *** ____________________________________________  RADIOLOGY I, Marlana Salvage, personally viewed and evaluated these images (plain radiographs) as part of my medical decision making, as well as reviewing the written report by the radiologist.  ED MD interpretation:  ***  Official radiology report(s): DG Ankle Complete Right  Result Date: 04/05/2020 CLINICAL  DATA:  Fall, ankle injury history of fall while roller-skating. EXAM: RIGHT ANKLE - COMPLETE 3+ VIEW; RIGHT FOOT COMPLETE - 3+ VIEW COMPARISON:  None FINDINGS: RIGHT ankle: Fracture dislocation of the RIGHT ankle with fractures of the lateral malleolus, oblique fracture with marked lateral displacement. Lateral translation of the talus with comminuted fracture of the medial malleolus translated laterally as well. Bony detail of the talus limited by overlying casting material. Potential fracture involving the posterior tibia though this is not definite. RIGHT foot: Fractures about the ankle are also noted on imaging of the RIGHT foot. Soft tissue swelling about the RIGHT lower extremity. Phalangeal assessment limited by overlap. IMPRESSION: Fracture dislocation about the RIGHT ankle with marked lateral translation of the talus, medial and lateral malleolus and potentially associated posterior malleolar trimalleolar fracture, potentially involving the posterolateral corner of the distal tibia. Electronically Signed   By: Zetta Bills M.D.   On: 04/05/2020 21:59   DG Foot Complete Right  Result Date: 04/05/2020 CLINICAL DATA:  Fall, ankle injury history of fall while roller-skating. EXAM: RIGHT ANKLE - COMPLETE 3+ VIEW; RIGHT FOOT COMPLETE - 3+ VIEW COMPARISON:  None FINDINGS: RIGHT ankle: Fracture dislocation of the RIGHT ankle with fractures of the lateral malleolus, oblique fracture with marked  lateral displacement. Lateral translation of the talus with comminuted fracture of the medial malleolus translated laterally as well. Bony detail of the talus limited by overlying casting material. Potential fracture involving the posterior tibia though this is not definite. RIGHT foot: Fractures about the ankle are also noted on imaging of the RIGHT foot. Soft tissue swelling about the RIGHT lower extremity. Phalangeal assessment limited by overlap. IMPRESSION: Fracture dislocation about the RIGHT ankle with marked  lateral translation of the talus, medial and lateral malleolus and potentially associated posterior malleolar trimalleolar fracture, potentially involving the posterolateral corner of the distal tibia. Electronically Signed   By: Zetta Bills M.D.   On: 04/05/2020 21:59    ____________________________________________   PROCEDURES  Procedure(s) performed (including Critical Care):  Procedures   ____________________________________________   INITIAL IMPRESSION / ASSESSMENT AND PLAN / ED COURSE  As part of my medical decision making, I reviewed the following data within the [place] {[id number]::"Notes from prior ED visits","[place] Controlled Substance Database"}        ***      ____________________________________________   FINAL CLINICAL IMPRESSION(S) / ED DIAGNOSES  Final diagnoses:  None     ED Discharge Orders    None      *Please note:  EDWIN CHERIAN was evaluated in Emergency Department on 04/05/2020 for the symptoms described in the history of present illness. She was evaluated in the context of the global COVID-19 pandemic, which necessitated consideration that the patient might be at risk for infection with the SARS-CoV-2 virus that causes COVID-19. Institutional protocols and algorithms that pertain to the evaluation of patients at risk for COVID-19 are in a state of rapid change based on information released by regulatory bodies including the CDC and federal and state organizations. These policies and algorithms were followed during the patient's care in the ED.  Some ED evaluations and interventions may be delayed as a result of limited staffing during and the pandemic.*   Note:  This document was prepared using Dragon voice recognition software and may include unintentional dictation errors.

## 2020-04-06 ENCOUNTER — Observation Stay
Admission: AD | Admit: 2020-04-06 | Discharge: 2020-04-07 | Disposition: A | Discharge status: Home / Self Care | Payer: 59 | Source: Ambulatory Visit | Attending: Surgery | Admitting: Surgery

## 2020-04-06 ENCOUNTER — Other Ambulatory Visit: Payer: Self-pay | Admitting: Surgery

## 2020-04-06 ENCOUNTER — Emergency Department: Payer: 59

## 2020-04-06 ENCOUNTER — Inpatient Hospital Stay: Payer: 59 | Admitting: Certified Registered Nurse Anesthetist

## 2020-04-06 ENCOUNTER — Inpatient Hospital Stay: Payer: 59

## 2020-04-06 ENCOUNTER — Encounter
Admission: AD | Disposition: A | Discharge status: Home / Self Care | Payer: Self-pay | Source: Ambulatory Visit | Attending: Surgery

## 2020-04-06 ENCOUNTER — Encounter: Payer: Self-pay | Admitting: Surgery

## 2020-04-06 DIAGNOSIS — S8251XA Displaced fracture of medial malleolus of right tibia, initial encounter for closed fracture: Secondary | ICD-10-CM | POA: Diagnosis not present

## 2020-04-06 DIAGNOSIS — Z79899 Other long term (current) drug therapy: Secondary | ICD-10-CM | POA: Diagnosis not present

## 2020-04-06 DIAGNOSIS — S82851A Displaced trimalleolar fracture of right lower leg, initial encounter for closed fracture: Principal | ICD-10-CM | POA: Insufficient documentation

## 2020-04-06 DIAGNOSIS — M25571 Pain in right ankle and joints of right foot: Secondary | ICD-10-CM | POA: Diagnosis not present

## 2020-04-06 DIAGNOSIS — Z9889 Other specified postprocedural states: Secondary | ICD-10-CM

## 2020-04-06 DIAGNOSIS — S82891A Other fracture of right lower leg, initial encounter for closed fracture: Secondary | ICD-10-CM | POA: Diagnosis not present

## 2020-04-06 DIAGNOSIS — Z20822 Contact with and (suspected) exposure to covid-19: Secondary | ICD-10-CM | POA: Diagnosis not present

## 2020-04-06 DIAGNOSIS — M25471 Effusion, right ankle: Secondary | ICD-10-CM | POA: Diagnosis not present

## 2020-04-06 DIAGNOSIS — S8292XD Unspecified fracture of left lower leg, subsequent encounter for closed fracture with routine healing: Secondary | ICD-10-CM | POA: Diagnosis not present

## 2020-04-06 DIAGNOSIS — G8918 Other acute postprocedural pain: Secondary | ICD-10-CM | POA: Diagnosis not present

## 2020-04-06 DIAGNOSIS — Z419 Encounter for procedure for purposes other than remedying health state, unspecified: Secondary | ICD-10-CM

## 2020-04-06 DIAGNOSIS — Y9351 Activity, roller skating (inline) and skateboarding: Secondary | ICD-10-CM | POA: Insufficient documentation

## 2020-04-06 HISTORY — PX: ORIF ANKLE FRACTURE: SHX5408

## 2020-04-06 LAB — RESP PANEL BY RT-PCR (FLU A&B, COVID) ARPGX2
Influenza A by PCR: NEGATIVE
Influenza B by PCR: NEGATIVE
SARS Coronavirus 2 by RT PCR: NEGATIVE

## 2020-04-06 LAB — POCT PREGNANCY, URINE: Preg Test, Ur: NEGATIVE

## 2020-04-06 SURGERY — OPEN REDUCTION INTERNAL FIXATION (ORIF) ANKLE FRACTURE
Anesthesia: General | Site: Ankle | Laterality: Right

## 2020-04-06 MED ORDER — VENLAFAXINE HCL ER 75 MG PO CP24
75.0000 mg | ORAL_CAPSULE | Freq: Every day | ORAL | Status: DC
  Administered 2020-04-07: 75 mg via ORAL
  Filled 2020-04-06 (×2): qty 1

## 2020-04-06 MED ORDER — HYDROMORPHONE HCL 1 MG/ML IJ SOLN
0.5000 mg | INTRAMUSCULAR | Status: DC | PRN
  Administered 2020-04-06: 0.5 mg via INTRAVENOUS

## 2020-04-06 MED ORDER — HYDROMORPHONE HCL 1 MG/ML IJ SOLN: 0.2500 mg | INTRAMUSCULAR | Status: DC | PRN

## 2020-04-06 MED ORDER — METOCLOPRAMIDE HCL 10 MG PO TABS: 5.0000 mg | ORAL_TABLET | Freq: Three times a day (TID) | ORAL | Status: DC | PRN

## 2020-04-06 MED ORDER — MIDAZOLAM HCL 2 MG/2ML IJ SOLN
INTRAMUSCULAR | Status: AC
  Filled 2020-04-06: qty 2

## 2020-04-06 MED ORDER — FLEET ENEMA 7-19 GM/118ML RE ENEM: 1.0000 | ENEMA | Freq: Once | RECTAL | Status: DC | PRN

## 2020-04-06 MED ORDER — HYDROMORPHONE HCL 1 MG/ML IJ SOLN
INTRAMUSCULAR | Status: AC
  Filled 2020-04-06: qty 1

## 2020-04-06 MED ORDER — OXYCODONE-ACETAMINOPHEN 5-325 MG PO TABS
1.0000 | ORAL_TABLET | Freq: Once | ORAL | Status: AC
  Administered 2020-04-06: 1 via ORAL
  Filled 2020-04-06: qty 1

## 2020-04-06 MED ORDER — CEFAZOLIN SODIUM-DEXTROSE 2-4 GM/100ML-% IV SOLN
2.0000 g | INTRAVENOUS | Status: AC
  Administered 2020-04-06: 2 g via INTRAVENOUS

## 2020-04-06 MED ORDER — ONDANSETRON HCL 4 MG PO TABS: 4.0000 mg | ORAL_TABLET | Freq: Four times a day (QID) | ORAL | Status: DC | PRN

## 2020-04-06 MED ORDER — BUPIVACAINE HCL (PF) 0.25 % IJ SOLN
INTRAMUSCULAR | Status: DC | PRN
Start: 2020-04-06 — End: 2020-04-06
  Administered 2020-04-06: 25 mL
  Administered 2020-04-06: 30 mL

## 2020-04-06 MED ORDER — FENTANYL CITRATE (PF) 100 MCG/2ML IJ SOLN
INTRAMUSCULAR | Status: AC
  Filled 2020-04-06: qty 4

## 2020-04-06 MED ORDER — OXYCODONE HCL 5 MG/5ML PO SOLN: 5.0000 mg | Freq: Once | ORAL | Status: DC | PRN

## 2020-04-06 MED ORDER — PROPOFOL 10 MG/ML IV BOLUS
INTRAVENOUS | Status: DC | PRN
  Administered 2020-04-06: 150 mg via INTRAVENOUS

## 2020-04-06 MED ORDER — DEXAMETHASONE SODIUM PHOSPHATE 10 MG/ML IJ SOLN
INTRAMUSCULAR | Status: AC
  Filled 2020-04-06: qty 1

## 2020-04-06 MED ORDER — ONDANSETRON HCL 4 MG/2ML IJ SOLN
4.0000 mg | Freq: Once | INTRAMUSCULAR | Status: AC | PRN
  Administered 2020-04-06: 4 mg via INTRAVENOUS

## 2020-04-06 MED ORDER — METOCLOPRAMIDE HCL 5 MG/ML IJ SOLN
5.0000 mg | Freq: Three times a day (TID) | INTRAMUSCULAR | Status: DC | PRN
Start: 2020-04-06 — End: 2020-04-07
  Administered 2020-04-06: 10 mg via INTRAVENOUS
  Filled 2020-04-06: qty 2

## 2020-04-06 MED ORDER — BUPIVACAINE HCL (PF) 0.5 % IJ SOLN
INTRAMUSCULAR | Status: AC
  Filled 2020-04-06: qty 30

## 2020-04-06 MED ORDER — ACETAMINOPHEN 500 MG PO TABS
1000.0000 mg | ORAL_TABLET | Freq: Four times a day (QID) | ORAL | Status: DC
  Administered 2020-04-06 – 2020-04-07 (×3): 1000 mg via ORAL
  Filled 2020-04-06 (×3): qty 2

## 2020-04-06 MED ORDER — SODIUM CHLORIDE 0.9 % IV SOLN: INTRAVENOUS | Status: DC

## 2020-04-06 MED ORDER — FENTANYL CITRATE (PF) 100 MCG/2ML IJ SOLN: 50.0000 ug | INTRAMUSCULAR | Status: DC | PRN

## 2020-04-06 MED ORDER — HYDROMORPHONE HCL 1 MG/ML IJ SOLN: 0.5000 mg | Freq: Once | INTRAMUSCULAR | Status: AC

## 2020-04-06 MED ORDER — DIPHENHYDRAMINE HCL 12.5 MG/5ML PO ELIX: 12.5000 mg | ORAL_SOLUTION | ORAL | Status: DC | PRN

## 2020-04-06 MED ORDER — KETOROLAC TROMETHAMINE 15 MG/ML IJ SOLN
15.0000 mg | Freq: Four times a day (QID) | INTRAMUSCULAR | Status: DC
  Administered 2020-04-06 – 2020-04-07 (×3): 15 mg via INTRAVENOUS
  Filled 2020-04-06 (×3): qty 1

## 2020-04-06 MED ORDER — HYDROMORPHONE HCL 1 MG/ML IJ SOLN
INTRAMUSCULAR | Status: DC | PRN
  Administered 2020-04-06 (×2): .5 mg via INTRAVENOUS

## 2020-04-06 MED ORDER — OXYCODONE HCL 5 MG PO TABS
5.0000 mg | ORAL_TABLET | Freq: Once | ORAL | Status: DC | PRN
Start: 2020-04-06 — End: 2020-04-06

## 2020-04-06 MED ORDER — SUMATRIPTAN SUCCINATE 50 MG PO TABS
25.0000 mg | ORAL_TABLET | ORAL | Status: DC | PRN
  Filled 2020-04-06: qty 1

## 2020-04-06 MED ORDER — CEFAZOLIN SODIUM-DEXTROSE 2-4 GM/100ML-% IV SOLN
2.0000 g | Freq: Four times a day (QID) | INTRAVENOUS | Status: AC
  Administered 2020-04-06 – 2020-04-07 (×3): 2 g via INTRAVENOUS
  Filled 2020-04-06 (×3): qty 100

## 2020-04-06 MED ORDER — KETAMINE HCL 10 MG/ML IJ SOLN
INTRAMUSCULAR | Status: AC | PRN
  Administered 2020-04-06: 25 mg via INTRAVENOUS

## 2020-04-06 MED ORDER — LIDOCAINE HCL (CARDIAC) PF 100 MG/5ML IV SOSY
PREFILLED_SYRINGE | INTRAVENOUS | Status: DC | PRN
  Administered 2020-04-06: 100 mg via INTRAVENOUS

## 2020-04-06 MED ORDER — OXYCODONE-ACETAMINOPHEN 5-325 MG PO TABS: 1.0000 | ORAL_TABLET | ORAL | 0 refills | Status: DC | PRN

## 2020-04-06 MED ORDER — MIDAZOLAM HCL 2 MG/2ML IJ SOLN
INTRAMUSCULAR | Status: AC
  Administered 2020-04-06: 1 mg via INTRAVENOUS
  Filled 2020-04-06: qty 2

## 2020-04-06 MED ORDER — MIDAZOLAM HCL 2 MG/2ML IJ SOLN
1.0000 mg | INTRAMUSCULAR | Status: AC | PRN
Start: 2020-04-06 — End: 2020-04-06
  Administered 2020-04-06: 2 mg via INTRAVENOUS

## 2020-04-06 MED ORDER — ONDANSETRON HCL 4 MG/2ML IJ SOLN: 4.0000 mg | Freq: Four times a day (QID) | INTRAMUSCULAR | Status: DC | PRN

## 2020-04-06 MED ORDER — CEFAZOLIN SODIUM-DEXTROSE 2-4 GM/100ML-% IV SOLN
INTRAVENOUS | Status: AC
  Filled 2020-04-06: qty 100

## 2020-04-06 MED ORDER — LACTATED RINGERS IV SOLN: INTRAVENOUS | Status: DC

## 2020-04-06 MED ORDER — HYDROMORPHONE HCL 1 MG/ML IJ SOLN
INTRAMUSCULAR | Status: AC
  Administered 2020-04-06: 0.5 mg via INTRAVENOUS
  Filled 2020-04-06: qty 0.5

## 2020-04-06 MED ORDER — DOCUSATE SODIUM 100 MG PO CAPS
100.0000 mg | ORAL_CAPSULE | Freq: Two times a day (BID) | ORAL | Status: DC
  Administered 2020-04-06 – 2020-04-07 (×2): 100 mg via ORAL
  Filled 2020-04-06 (×2): qty 1

## 2020-04-06 MED ORDER — ONDANSETRON HCL 4 MG/2ML IJ SOLN
INTRAMUSCULAR | Status: DC | PRN
  Administered 2020-04-06 (×2): 4 mg via INTRAVENOUS

## 2020-04-06 MED ORDER — BISACODYL 10 MG RE SUPP: 10.0000 mg | Freq: Every day | RECTAL | Status: DC | PRN

## 2020-04-06 MED ORDER — CHLORHEXIDINE GLUCONATE 0.12 % MT SOLN
15.0000 mL | Freq: Once | OROMUCOSAL | Status: AC
  Administered 2020-04-06: 15 mL via OROMUCOSAL

## 2020-04-06 MED ORDER — ACETAMINOPHEN 325 MG PO TABS: 325.0000 mg | ORAL_TABLET | Freq: Four times a day (QID) | ORAL | Status: DC | PRN

## 2020-04-06 MED ORDER — ONDANSETRON HCL 4 MG/2ML IJ SOLN
INTRAMUSCULAR | Status: AC
  Filled 2020-04-06: qty 2

## 2020-04-06 MED ORDER — KETOROLAC TROMETHAMINE 30 MG/ML IJ SOLN
INTRAMUSCULAR | Status: AC
  Administered 2020-04-06: 30 mg via INTRAVENOUS
  Filled 2020-04-06: qty 1

## 2020-04-06 MED ORDER — CHLORHEXIDINE GLUCONATE 0.12 % MT SOLN
OROMUCOSAL | Status: AC
  Filled 2020-04-06: qty 15

## 2020-04-06 MED ORDER — BUPIVACAINE HCL 0.5 % IJ SOLN
INTRAMUSCULAR | Status: DC | PRN
  Administered 2020-04-06: 20 mL

## 2020-04-06 MED ORDER — OXYCODONE HCL 5 MG PO TABS
5.0000 mg | ORAL_TABLET | ORAL | Status: DC | PRN
  Administered 2020-04-07: 5 mg via ORAL
  Filled 2020-04-06: qty 1

## 2020-04-06 MED ORDER — ORAL CARE MOUTH RINSE: 15.0000 mL | Freq: Once | OROMUCOSAL | Status: AC

## 2020-04-06 MED ORDER — FENTANYL CITRATE (PF) 100 MCG/2ML IJ SOLN: 25.0000 ug | INTRAMUSCULAR | Status: DC | PRN

## 2020-04-06 MED ORDER — KETOROLAC TROMETHAMINE 30 MG/ML IJ SOLN: 30.0000 mg | Freq: Once | INTRAMUSCULAR | Status: AC

## 2020-04-06 MED ORDER — PANTOPRAZOLE SODIUM 40 MG PO TBEC
40.0000 mg | DELAYED_RELEASE_TABLET | Freq: Every day | ORAL | Status: DC
  Administered 2020-04-07: 40 mg via ORAL
  Filled 2020-04-06: qty 1

## 2020-04-06 MED ORDER — MAGNESIUM HYDROXIDE 400 MG/5ML PO SUSP: 30.0000 mL | Freq: Every day | ORAL | Status: DC | PRN

## 2020-04-06 MED ORDER — FENTANYL CITRATE (PF) 100 MCG/2ML IJ SOLN
INTRAMUSCULAR | Status: AC
  Administered 2020-04-06: 50 ug via INTRAVENOUS
  Filled 2020-04-06: qty 2

## 2020-04-06 MED ORDER — OXYCODONE HCL 5 MG PO TABS
5.0000 mg | ORAL_TABLET | ORAL | Status: DC | PRN
  Administered 2020-04-06: 5 mg via ORAL
  Filled 2020-04-06: qty 1

## 2020-04-06 MED ORDER — PROPOFOL 10 MG/ML IV BOLUS
INTRAVENOUS | Status: AC
  Filled 2020-04-06: qty 20

## 2020-04-06 MED ORDER — ENOXAPARIN SODIUM 60 MG/0.6ML ~~LOC~~ SOLN
0.5000 mg/kg | SUBCUTANEOUS | Status: DC
  Administered 2020-04-07: 50 mg via SUBCUTANEOUS
  Filled 2020-04-06: qty 0.6

## 2020-04-06 MED ORDER — FENTANYL CITRATE (PF) 100 MCG/2ML IJ SOLN
INTRAMUSCULAR | Status: DC | PRN
  Administered 2020-04-06: 50 ug via INTRAVENOUS

## 2020-04-06 MED ORDER — ACETAMINOPHEN 10 MG/ML IV SOLN: 1000.0000 mg | Freq: Once | INTRAVENOUS | Status: DC | PRN

## 2020-04-06 SURGICAL SUPPLY — 71 items
ADAPTER GUIDE FAST 1.6 (MISCELLANEOUS) ×2 IMPLANT
BIT DRILL 2.5X2.75 QC CALB (BIT) ×2 IMPLANT
BIT DRILL 2.9 CANN QC NONSTRL (BIT) ×2 IMPLANT
BIT DRILL 3.5X5.5 QC CALB (BIT) ×2 IMPLANT
BIT DRILL CALIBRATED 2.7 (BIT) ×2 IMPLANT
BLADE SURG SZ10 CARB STEEL (BLADE) ×4 IMPLANT
BNDG COHESIVE 4X5 TAN STRL (GAUZE/BANDAGES/DRESSINGS) IMPLANT
BNDG ELASTIC 4X5.8 VLCR STR LF (GAUZE/BANDAGES/DRESSINGS) IMPLANT
BNDG ELASTIC 6X5.8 VLCR STR LF (GAUZE/BANDAGES/DRESSINGS) IMPLANT
BNDG ESMARK 6X12 TAN STRL LF (GAUZE/BANDAGES/DRESSINGS) ×2 IMPLANT
BNDG PLASTER FAST 4X5 WHT LF (CAST SUPPLIES) ×8 IMPLANT
CHLORAPREP W/TINT 26 (MISCELLANEOUS) ×4 IMPLANT
COVER WAND RF STERILE (DRAPES) ×2 IMPLANT
CUFF TOURN SGL QUICK 24 (TOURNIQUET CUFF)
CUFF TOURN SGL QUICK 30 (TOURNIQUET CUFF) ×1
CUFF TRNQT CYL 24X4X16.5-23 (TOURNIQUET CUFF) IMPLANT
CUFF TRNQT CYL 30X4X21-28X (TOURNIQUET CUFF) ×1 IMPLANT
DRAPE C-ARM XRAY 36X54 (DRAPES) ×2 IMPLANT
DRAPE C-ARMOR (DRAPES) ×2 IMPLANT
DRAPE INCISE IOBAN 66X45 STRL (DRAPES) ×2 IMPLANT
DRAPE SPLIT 6X30 W/TAPE (DRAPES) ×2 IMPLANT
DRAPE U-SHAPE 47X51 STRL (DRAPES) ×2 IMPLANT
ELECT CAUTERY BLADE 6.4 (BLADE) ×2 IMPLANT
ELECT REM PT RETURN 9FT ADLT (ELECTROSURGICAL) ×2
ELECTRODE REM PT RTRN 9FT ADLT (ELECTROSURGICAL) ×1 IMPLANT
GAUZE SPONGE 4X4 12PLY STRL (GAUZE/BANDAGES/DRESSINGS) ×2 IMPLANT
GAUZE XEROFORM 1X8 LF (GAUZE/BANDAGES/DRESSINGS) ×2 IMPLANT
GLOVE INDICATOR 8.0 STRL GRN (GLOVE) ×2 IMPLANT
GLOVE SURG ENC MOIS LTX SZ8 (GLOVE) ×4 IMPLANT
GOWN STRL REUS W/ TWL LRG LVL3 (GOWN DISPOSABLE) ×1 IMPLANT
GOWN STRL REUS W/ TWL XL LVL3 (GOWN DISPOSABLE) ×1 IMPLANT
GOWN STRL REUS W/TWL LRG LVL3 (GOWN DISPOSABLE) ×1
GOWN STRL REUS W/TWL XL LVL3 (GOWN DISPOSABLE) ×1
HEMOVAC 400ML (MISCELLANEOUS)
K-WIRE ACE 1.6X6 (WIRE) ×6
KIT DRAIN HEMOVAC JP 7FR 400ML (MISCELLANEOUS) IMPLANT
KIT TURNOVER KIT A (KITS) ×2 IMPLANT
KWIRE ACE 1.6X6 (WIRE) ×3 IMPLANT
LABEL OR SOLS (LABEL) IMPLANT
MANIFOLD NEPTUNE II (INSTRUMENTS) ×2 IMPLANT
NS IRRIG 1000ML POUR BTL (IV SOLUTION) IMPLANT
PACK EXTREMITY ARMC (MISCELLANEOUS) ×2 IMPLANT
PAD ABD DERMACEA PRESS 5X9 (GAUZE/BANDAGES/DRESSINGS) ×4 IMPLANT
PAD CAST CTTN 4X4 STRL (SOFTGOODS) IMPLANT
PAD PREP 24X41 OB/GYN DISP (PERSONAL CARE ITEMS) IMPLANT
PADDING CAST COTTON 4X4 STRL (SOFTGOODS)
PLATE LOCK 7H 92 BILAT FIB (Plate) IMPLANT
PLATE LOCK 8H 103 BILAT FIB (Plate) ×2 IMPLANT
SCREW ACE CAN 4.0 40M (Screw) ×4 IMPLANT
SCREW CORTICAL 3.5MM  12MM (Screw) ×1 IMPLANT
SCREW CORTICAL 3.5MM  16MM (Screw) ×1 IMPLANT
SCREW CORTICAL 3.5MM  20MM (Screw) ×2 IMPLANT
SCREW CORTICAL 3.5MM 12MM (Screw) ×1 IMPLANT
SCREW CORTICAL 3.5MM 14MM (Screw) ×2 IMPLANT
SCREW CORTICAL 3.5MM 16MM (Screw) ×1 IMPLANT
SCREW CORTICAL 3.5MM 18MM (Screw) ×2 IMPLANT
SCREW CORTICAL 3.5MM 20MM (Screw) ×2 IMPLANT
SCREW CORTICAL 3.5MM 24MM (Screw) ×2 IMPLANT
SCREW LOCK CORT STAR 3.5X10 (Screw) ×2 IMPLANT
SCREW LOCK CORT STAR 3.5X14 (Screw) ×6 IMPLANT
SPLINT CAST 1 STEP 4X30 (MISCELLANEOUS) ×4 IMPLANT
SPONGE LAP 18X18 RF (DISPOSABLE) ×2 IMPLANT
STAPLER SKIN PROX 35W (STAPLE) ×2 IMPLANT
STOCKINETTE IMPERV 14X48 (MISCELLANEOUS) ×2 IMPLANT
SUT VIC AB 0 CT1 36 (SUTURE) ×2 IMPLANT
SUT VIC AB 2-0 CT1 (SUTURE) ×2 IMPLANT
SUT VIC AB 2-0 SH 27 (SUTURE) ×2
SUT VIC AB 2-0 SH 27XBRD (SUTURE) ×2 IMPLANT
SUT VIC AB 3-0 SH 27 (SUTURE) ×1
SUT VIC AB 3-0 SH 27X BRD (SUTURE) ×1 IMPLANT
SYR 10ML LL (SYRINGE) ×2 IMPLANT

## 2020-04-06 NOTE — Progress Notes (Signed)
Called by ED staff regarding patient's injury. Imaging reviewed, and there appears to be a trimalleolar ankle fracture that underwent subsequent reduction. I talked to the patient's husband over the phone and we discussed that surgical intervention with ORIF would likely lead to most optimal long-term outcome. I offered admission and surgery later today (04/06/20) vs surgery to be done as an outpatient as the post-reduction alignment is acceptable enough for outpatient follow up. Patient preference would be for outpatient follow up. They will plan to contact the [facility] offices in the AM. I also recommended obtaining CT scan for further evaluation of the size of the posterior malleolar fragment.

## 2020-04-06 NOTE — Anesthesia Procedure Notes (Signed)
Anesthesia Regional Block: Adductor canal block   Pre-Anesthetic Checklist: ,, timeout performed, Correct Patient, Correct Site, Correct Laterality, Correct Procedure, Correct Position, site marked, Risks and benefits discussed,  Surgical consent,  Pre-op evaluation,  At surgeon's request and post-op pain management  Laterality: Lower and Right  Prep: chloraprep       Needles:  Injection technique: Single-shot  Needle Type: Echogenic Needle     Needle Length: 9cm  Needle Gauge: 21     Additional Needles:   Procedures:,,,, ultrasound used (permanent image in chart),,,,  Narrative:  Start time: 04/06/2020 3:59 PM End time: 04/06/2020 4:02 PM Injection made incrementally with aspirations every 5 mL.  Performed by: Personally  Anesthesiologist: Arita Miss, MD  Additional Notes: Patient's chart reviewed and they were deemed appropriate candidate for procedure. Patient educated about risks, benefits, and alternatives of the block including but not limited to: temporary or permanent nerve damage, bleeding, infection, damage to surround tissues, block failure, local anesthetic toxicity. Patient expressed understanding. A formal time-out was conducted consistent with institution rules.  Monitors were applied, and minimal sedation used (see nursing record). The site was prepped with skin prep and allowed to dry, and sterile gloves were used. A high frequency linear ultrasound probe with probe cover was utilized. Femoral artery visualized at mid-thigh level, local anesthetic injected anterolateral to it, and echogenic block needle trajectory was monitored throughout. Hydrodissection of saphenous nerve visualized. Aspiration performed every 76ml. Blood vessels were avoided. All injections were performed without resistance and free of blood and paresthesias. The patient tolerated the procedure well.  Injectate: 61ml 0.25% bupivacaine + 5mg  decadron

## 2020-04-06 NOTE — ED Provider Notes (Signed)
.Sedation  Date/Time: 04/06/2020 1:32 AM Performed by: Merlyn Lot, MD Authorized by: Merlyn Lot, MD   Consent:    Consent obtained:  Written (electronic informed consent)   Consent given by:  Patient   Risks discussed:  Allergic reaction, dysrhythmia, inadequate sedation, nausea, vomiting, respiratory compromise necessitating ventilatory assistance and intubation, prolonged sedation necessitating reversal and prolonged hypoxia resulting in organ damage Universal protocol:    Procedure explained and questions answered to patient or proxy's satisfaction: yes     Relevant documents present and verified: yes     Test results available: yes     Imaging studies available: yes     Required blood products, implants, devices, and special equipment available: yes     Immediately prior to procedure, a time out was called: yes     Patient identity confirmed:  Arm band Indications:    Procedure performed:  Fracture reduction   Procedure necessitating sedation performed by:  Physician performing sedation Pre-sedation assessment:    Time since last food or drink:  4   ASA classification: class 1 - normal, healthy patient     Mouth opening:  3 or more finger widths   Thyromental distance:  4 finger widths   Mallampati score:  I - soft palate, uvula, fauces, pillars visible   Neck mobility: normal     Pre-sedation assessments completed and reviewed: airway patency, cardiovascular function, hydration status, mental status, nausea/vomiting, pain level, respiratory function and temperature   Immediate pre-procedure details:    Reassessment: Patient reassessed immediately prior to procedure     Reviewed: vital signs, relevant labs/tests and NPO status     Verified: bag valve mask available, emergency equipment available, intubation equipment available, IV patency confirmed, oxygen available, reversal medications available and suction available   Procedure details (see MAR for exact dosages):     Preoxygenation:  Nasal cannula   Sedation:  Propofol and ketamine   Intended level of sedation: deep   Intra-procedure monitoring:  Blood pressure monitoring, continuous pulse oximetry, cardiac monitor, frequent vital sign checks and frequent LOC assessments   Intra-procedure events: none     Total Provider sedation time (minutes):  10 Post-procedure details:    Attendance: Constant attendance by certified staff until patient recovered     Recovery: Patient returned to pre-procedure baseline     Post-sedation assessments completed and reviewed: airway patency, cardiovascular function, hydration status, mental status and respiratory function     Patient is stable for discharge or admission: yes     Procedure completion:  Tolerated well, no immediate complications .Ortho Injury Treatment  Date/Time: 04/06/2020 1:34 AM Performed by: Merlyn Lot, MD Authorized by: Merlyn Lot, MD   Consent:    Consent obtained:  Written   Consent given by:  Patient   Risks discussed:  Fracture, nerve damage, irreducible dislocation, recurrent dislocation, restricted joint movement, stiffness and vascular damageInjury location: ankle Injury type: fracture-dislocation Fracture type: trimalleolar Pre-procedure neurovascular assessment: neurovascularly intact Pre-procedure range of motion: reduced  Patient sedated: Yes. Refer to sedation procedure documentation for details of sedation. Manipulation performed: yes Skin traction used: yes Reduction successful: yes X-ray confirmed reduction: yes Immobilization: splint Splint type: short leg Splint Applied by: ED Provider Supplies used: Ortho-Glass Post-procedure neurovascular assessment: post-procedure neurovascularly intact   Care transferred from Flex.  Patient consented to right ankle reduction which was performed as described above and patient tolerated very well.  Discussed case with Dr. Posey Pronto of orthopedics.  Family and patient  decided to have this  managed as an outpatient.  Have ordered CT of the ankle Dr. Serita Grit request.  Pain medication sent to their pharmacy.  We discussed strict return precautions.  Patient agreeable to plan.     Merlyn Lot, MD 04/06/20 [phone]

## 2020-04-06 NOTE — Anesthesia Procedure Notes (Signed)
Anesthesia Regional Block: Popliteal block   Pre-Anesthetic Checklist: ,, timeout performed, Correct Patient, Correct Site, Correct Laterality, Correct Procedure, Correct Position, site marked, Risks and benefits discussed,  Surgical consent,  Pre-op evaluation,  At surgeon's request and post-op pain management  Laterality: Lower and Right  Prep: chloraprep       Needles:  Injection technique: Single-shot  Needle Type: Echogenic Needle     Needle Length: 9cm  Needle Gauge: 21     Additional Needles:   Procedures:,,,, ultrasound used (permanent image in chart),,,,  Narrative:  Start time: 04/06/2020 4:02 PM End time: 04/06/2020 4:05 PM Injection made incrementally with aspirations every 5 mL.  Performed by: Personally  Anesthesiologist: Arita Miss, MD  Additional Notes: Patient consented for risk and benefits of nerve block including but not limited to nerve damage, failed block, bleeding and infection.  Patient voiced understanding.  Functioning IV was confirmed and monitors were applied.  A echogenic needle was used. Sterile prep,hand hygiene and sterile gloves were used. Minimal sedation used for procedure.   No paresthesia endorsed by patient during the procedure.  Negative aspiration and negative test dose prior to incremental administration of local anesthetic. The patient tolerated the procedure well with no immediate complications.  Injectate: 66ml 0.25% bupivacaine + 5mg  decadron.

## 2020-04-06 NOTE — Progress Notes (Signed)
PHARMACIST - PHYSICIAN COMMUNICATION  CONCERNING:  Enoxaparin (Lovenox) for DVT Prophylaxis    RECOMMENDATION: Patient was prescribed enoxaprin 40mg  q24 hours for VTE prophylaxis.   There were no vitals filed for this visit.  There is no height or weight on file to calculate BMI.  CrCl cannot be calculated (Patient's most recent lab result is older than the maximum 21 days allowed.).   Based on [place] patient is candidate for enoxaparin 0.5mg /kg TBW SQ every 24 hours based on BMI being >30.  DESCRIPTION: Pharmacy has adjusted enoxaparin dose per [facility] policy.  Patient is now receiving enoxaparin 0.5 mg/kg every 24 hours   Renda Rolls, PharmD, [facility] 04/06/2020 9:47 PM

## 2020-04-06 NOTE — Anesthesia Procedure Notes (Signed)
Procedure Name: LMA Insertion Date/Time: 04/06/2020 5:52 PM Performed by: Nelda Marseille, CRNA Pre-anesthesia Checklist: Patient identified, Patient being monitored, Timeout performed, Emergency Drugs available and Suction available Patient Re-evaluated:Patient Re-evaluated prior to induction Oxygen Delivery Method: Circle system utilized Preoxygenation: Pre-oxygenation with 100% oxygen Induction Type: IV induction Ventilation: Mask ventilation without difficulty LMA: LMA inserted LMA Size: 4.0 Tube type: Oral Number of attempts: 1 Placement Confirmation: positive ETCO2 and breath sounds checked- equal and bilateral Tube secured with: Tape Dental Injury: Teeth and Oropharynx as per pre-operative assessment

## 2020-04-06 NOTE — Op Note (Signed)
04/06/2020  7:28 PM  Patient:   Valerie Lindsey  Pre-Op Diagnosis:   Closed trimalleolar fracture-dislocation right ankle.  Post-Op Diagnosis:   Same.  Procedure:   Open reduction and internal fixation of medial and lateral malleolar fractures, right ankle.  Surgeon:   Pascal Lux, MD  Assistant:   Jaynie Bream, PA-S  Anesthesia:   General LMA with popliteal block placed by anesthesiologist in preoperative holding area  Findings:   As above.  Complications:   None  EBL:   20 cc  Fluids:   1200 cc crystalloid  UOP:   None cc  TT:   94 min at 300 mmHg  Drains:   None  Closure:   Staples  Implants:   Biomet ALPS 8-hole composite locking plate and screws  Brief Clinical Note:   The patient is a 45 year old female who sustained the above-noted injury last evening when she apparently fell awkwardly while rollerskating she presented to the emergency room where x-rays demonstrated the above-noted fracture pattern. The ankle was reduced and she was placed into a posterior splint. The patient presents at this time for definitive management of her injury.  Procedure:   The patient underwent the placement of a popliteal block by the anesthesiologist in the preoperative holding area before she was brought into the operating room and lain in the supine position. After adequate general laryngeal mask anesthesia was obtained, the right foot and lower leg were prepped with ChloraPrep solution, then draped sterilely. Preoperative antibiotics were administered. A timeout was performed to verify the appropriate surgical site before the limb was exsanguinated with an Esmarch and the thigh tourniquet inflated to 300 mmHg.   Laterally, an 8-10 cm incision was made over the lateral aspect of the distal fibula. The incision was carried down through the subcutaneous tissues to expose the fracture site. The fracture hematoma was debrided before the fracture was reduced and temporarily secured  using a bone clamp. A lag screw was placed in an anterior to posterior direction perpendicular to the fracture. An 8-hole Biomet composite locking plate was contoured using the appropriate plate benders before it was applied over the lateral aspect of the distal fibula. After verifying its position fluoroscopically, it was secured using a 3.5 mm nonlocking cortical screw proximal to the fracture. Again the plate's position was adjusted slightly based on AP and lateral projections before it was secured using additional bicortical screws proximally and multiple locking screws distally. The adequacy of fracture reduction and hardware position was verified fluoroscopically in AP and lateral projections and found to be excellent. Several screws proximally were removed and replaced with shorter screws as they appeared to be too long, as was the anterior posterior lag screw.  Attention was directed to the medial side. An approximately 3-3.5 cm longitudinal incision was made over the medial and distal portions of the medial malleolus. This incision was carried down through the subcutaneous tissues to expose the fracture site. Care was taken to identify and protect the saphenous nerve and vein. The fracture hematoma again was removed before the fracture was reduced. Two guidewires were placed obliquely across the fracture from distal to proximal into the distal tibial metaphysis. After verifying their positions fluoroscopically, each guidewire was sequentially over-reamed and replaced with a 40 mm partially threaded 4.0 cancellous screw in lag fashion. Again the adequacy of fracture reduction, hardware position, and mortise restoration was verified in AP, lateral, and oblique projections and found to be excellent.  Each wound was copiously irrigated  with sterile saline solution. Laterally, the subcutaneous tissues were closed in two layers using 2-0 Vicryl interrupted sutures before the skin was closed using staples.  Medially, the subcutaneous tissues were closed using 3-0 Vicryl interrupted sutures before the skin was closed using staples. A total of 20 cc of 0.5% plain Sensorcaine was injected in and around the incision sites to help with postoperative analgesia. Sterile bulky dressings were applied to the wounds before the patient was placed into a posterior splint with a sugar tong supplement, maintaining the ankle in neutral dorsiflexion. The patient was then awakened, extubated, and returned to the recovery room in satisfactory condition after tolerating the procedure well.

## 2020-04-06 NOTE — H&P (Signed)
Subjective:  Chief complaint: Right ankle pain.  The patient is a 45 y.o. female who sustained an injury to the right ankle last evening when she apparently lost her balance and fell while doing the "[place]" rollerskating with her daughter. She was brought to the emergency room where x-rays demonstrated the above-noted injury. The fracture was reduced in the emergency room and she was placed into a posterior splint. The patient denies any associated injury. The patient did not strike her head or lose consciousness. The patient also denies any light-headedness, dizziness, chest pain, or shortness of breath which might have contributed to the injury.  Patient Active Problem List   Diagnosis Date Noted  . GERD (gastroesophageal reflux disease) 05/11/2019  . Irregular menses 05/11/2019  . Obesity (BMI 30.0-34.9) 06/14/2017  . History of anemia 06/14/2017  . Routine general medical examination at a health care facility 06/11/2015  . Migraine 06/11/2015   Past Medical History:  Diagnosis Date  . Amenorrhea   . Cholelithiasis   . GERD (gastroesophageal reflux disease)   . Migraine   . Ovarian cyst     Past Surgical History:  Procedure Laterality Date  . CHOLECYSTECTOMY  2014    Medications Prior to Admission  Medication Sig Dispense Refill Last Dose  . acetaminophen (TYLENOL) 325 MG tablet Take 650 mg by mouth every 6 (six) hours as needed.   04/05/2020 at Unknown time  . ibuprofen (ADVIL) 600 MG tablet Take 600 mg by mouth every 6 (six) hours as needed.   04/05/2020 at Unknown time  . naproxen sodium (ALEVE) 220 MG tablet Take 220 mg by mouth.   Past Week at Unknown time  . omeprazole (PRILOSEC) 40 MG capsule TAKE 1 CAPSULE (40 MG TOTAL) BY MOUTH DAILY. 90 capsule 1 04/05/2020 at Unknown time  . oxyCODONE-acetaminophen (PERCOCET) 5-325 MG tablet Take 1 tablet by mouth every 4 (four) hours as needed for severe pain. 16 tablet 0 04/06/2020 at Unknown time  . venlafaxine XR (EFFEXOR-XR)  37.5 MG 24 hr capsule TAKE 2 CAPSULES (75 MG) BY MOUTH DAILY WITH BREAKFAST. 60 capsule 3 04/05/2020 at Unknown time  . SUMAtriptan (IMITREX) 100 MG tablet TAKE 1 TABLET AT START OF MIGRAINE. MAY HAVE SECOND DOSE IN 2 HOURS. 9 tablet 5    No Known Allergies  Social History   Tobacco Use  . Smoking status: Never Smoker  . Smokeless tobacco: Never Used  Substance Use Topics  . Alcohol use: No    Alcohol/week: 0.0 standard drinks    Family History  Problem Relation Age of Onset  . Prostate cancer Father   . Heart disease Father   . Hyperlipidemia Mother   . Heart disease Mother   . Hypertension Mother   . Stroke Paternal Grandmother   . Breast cancer Neg Hx      Review of Systems: As noted above. The patient denies any chest pain, shortness of breath, nausea, vomiting, diarrhea, constipation, belly pain, blood in his/her stool, or burning with urination.  Objective: Temp:  [97.9 F (36.6 C)-98.2 F (36.8 C)] 98.2 F (36.8 C) (03/11 1431) Pulse Rate:  [63-137] 97 (03/11 1610) Resp:  [10-20] 10 (03/11 1610) BP: (129-182)/(72-113) 147/84 (03/11 1610) SpO2:  [96 %-100 %] 97 % (03/11 1610) Weight:  [102.1 kg] 102.1 kg (03/10 2047)  Physical Exam: General:  Alert, no acute distress Psychiatric:  Patient is competent for consent with normal mood and affect Cardiovascular:  RRR  Respiratory:  Clear to auscultation. No wheezing. Non-labored breathing  GI:  Abdomen is soft and non-tender Skin:  No lesions in the area of chief complaint Neurologic:  Sensation intact distally Lymphatic:  No axillary or cervical lymphadenopathy  Orthopedic Exam:  Orthopedic examination is limited to the right lower extremity and foot. The patient is in a well molded splint which appears to be in good condition. The skin is intact at the proximal distal margins of the splint. She is able to dorsiflex and plantarflex her toes.Sensation is intact to light touch to the tips of her toes. She has good  capillary refill to her toes.  Imaging Review: Recent pre and post reduction x-rays of the right ankle are available for review. These films demonstrate a well reduced trimalleolar fracture dislocation of the right ankle with a small posterior malleolar fragment that appears to involve less than 10% of the tibial articular surface. No significant degenerative changes are identified. No lytic lesions or other acute bony abnormalities are noted.  Assessment: Closed trimalleolar fracture-dislocation, right ankle.  Plan: The treatment options, including both surgical and nonsurgical choices, have been discussed in detail with the patient and her family. The patient like to proceed with surgical intervention to include an open reduction and internal fixation of the medial and lateral malleolar fractures of her trimalleolar right ankle fracture-dislocation. The risks (including bleeding, infection, nerve and/or blood vessel injury, persistent or recurrent pain, loosening or failure of the components, stiffness, degenerative joint disease, malunion or nonunion, need for further surgery, blood clots, strokes, heart attacks or arrhythmias, pneumonia, etc.) and benefits of the surgical procedure were discussed. The patient states her understanding and agrees to proceed. A formal written consent will be obtained by the nursing staff.

## 2020-04-06 NOTE — ED Notes (Signed)
PT verbally signed discharge form at this time

## 2020-04-06 NOTE — Anesthesia Preprocedure Evaluation (Signed)
Anesthesia Evaluation  Patient identified by MRN, date of birth, ID band Patient awake  General Assessment Comment:Ate chick fil a at 0830.  Reviewed: Allergy & Precautions, NPO status , Patient's Chart, lab work & pertinent test results  History of Anesthesia Complications Negative for: history of anesthetic complications  Airway Mallampati: II  TM Distance: >3 FB Neck ROM: Full    Dental no notable dental hx. (+) Teeth Intact   Pulmonary neg pulmonary ROS, neg sleep apnea, neg COPD, Patient abstained from smoking.Not current smoker,    Pulmonary exam normal breath sounds clear to auscultation       Cardiovascular Exercise Tolerance: Good METS(-) hypertension(-) CAD and (-) Past MI negative cardio ROS  (-) dysrhythmias  Rhythm:Regular Rate:Normal - Systolic murmurs    Neuro/Psych  Headaches, negative neurological ROS  negative psych ROS   GI/Hepatic GERD  Medicated and Controlled,(+)     (-) substance abuse  ,   Endo/Other  neg diabetes  Renal/GU negative Renal ROS     Musculoskeletal   Abdominal   Peds  Hematology   Anesthesia Other Findings Past Medical History: No date: Amenorrhea No date: Cholelithiasis No date: GERD (gastroesophageal reflux disease) No date: Migraine No date: Ovarian cyst  Reproductive/Obstetrics                             Anesthesia Physical Anesthesia Plan  ASA: II  Anesthesia Plan: General   Post-op Pain Management:  Regional for Post-op pain   Induction: Intravenous  PONV Risk Score and Plan: 3 and Ondansetron, Dexamethasone and Midazolam  Airway Management Planned: LMA  Additional Equipment: None  Intra-op Plan:   Post-operative Plan: Extubation in OR  Informed Consent: I have reviewed the patients History and Physical, chart, labs and discussed the procedure including the risks, benefits and alternatives for the proposed anesthesia with  the patient or authorized representative who has indicated his/her understanding and acceptance.     Dental advisory given  Plan Discussed with: CRNA and Surgeon  Anesthesia Plan Comments: (Discussed risks of anesthesia with patient, including PONV, sore throat, lip/dental damage. Rare risks discussed as well, such as cardiorespiratory and neurological sequelae. Patient understands. Discussed r/b/a of adductor canal nerve block and popliteal nerve blocks, including:  - bleeding, infection, nerve damage - poor or non functioning block. Patient understands. )        Anesthesia Quick Evaluation

## 2020-04-06 NOTE — Transfer of Care (Signed)
Immediate Anesthesia Transfer of Care Note  Patient: Valerie Lindsey  Procedure(s) Performed: OPEN REDUCTION INTERNAL FIXATION (ORIF) ANKLE FRACTURE (Right Ankle)  Patient Location: PACU  Anesthesia Type:General and Regional  Level of Consciousness: awake, drowsy and patient cooperative  Airway & Oxygen Therapy: Patient Spontanous Breathing and Patient connected to face mask oxygen  Post-op Assessment: Report given to RN  Post vital signs: Reviewed and stable  Last Vitals:  Vitals Value Taken Time  BP 153/93 04/06/20 1935  Temp 36.3 C 04/06/20 1935  Pulse 116 04/06/20 1937  Resp 8 04/06/20 1937  SpO2 96 % 04/06/20 1937  Vitals shown include unvalidated device data.  Last Pain:  Vitals:   04/06/20 1431  PainSc: 10-Worst pain ever         Complications: No complications documented.

## 2020-04-07 DIAGNOSIS — Z20822 Contact with and (suspected) exposure to covid-19: Secondary | ICD-10-CM | POA: Diagnosis not present

## 2020-04-07 DIAGNOSIS — S82851A Displaced trimalleolar fracture of right lower leg, initial encounter for closed fracture: Secondary | ICD-10-CM | POA: Diagnosis not present

## 2020-04-07 DIAGNOSIS — Z79899 Other long term (current) drug therapy: Secondary | ICD-10-CM | POA: Diagnosis not present

## 2020-04-07 MED ORDER — ASPIRIN EC 325 MG PO TBEC: 325.0000 mg | DELAYED_RELEASE_TABLET | Freq: Every day | ORAL | 0 refills | Status: DC

## 2020-04-07 MED ORDER — PROMETHAZINE HCL 12.5 MG PO TABS: 12.5000 mg | ORAL_TABLET | Freq: Four times a day (QID) | ORAL | 0 refills | Status: DC | PRN

## 2020-04-07 MED ORDER — PROMETHAZINE HCL 25 MG/ML IJ SOLN
12.5000 mg | Freq: Once | INTRAMUSCULAR | Status: AC
  Administered 2020-04-07: 12.5 mg via INTRAVENOUS
  Filled 2020-04-07: qty 1

## 2020-04-07 MED ORDER — DEXAMETHASONE SODIUM PHOSPHATE 10 MG/ML IJ SOLN
INTRAMUSCULAR | Status: DC | PRN
  Administered 2020-04-06 (×2): 5 mg

## 2020-04-07 NOTE — Discharge Summary (Signed)
Physician Discharge Summary  Patient ID: Valerie Lindsey MRN: [id number] DOB/AGE: 1975-09-15 45 y.o.  Admit date: 04/06/2020 Discharge date: 04/07/2020  Admission Diagnoses:  Status post open reduction with internal fixation (ORIF) of fracture of ankle [Z98.890, Z87.81] Ankle fracture, right [S82.891A]  Surgeries:Procedure(s):  Open reduction and internal fixation of medial and lateral malleolar fractures, right ankle.  Surgeon:   Pascal Lux, MD  Assistant:   Jaynie Bream, PA-S  Anesthesia:   General LMA with popliteal block placed by anesthesiologist in preoperative holding area  Findings:   As above.  Complications:   None  EBL:   20 cc  Fluids:   1200 cc crystalloid  UOP:   None cc  TT:   94 min at 300 mmHg  Drains:   None  Closure:   Staples  Implants:   Biomet ALPS 8-hole composite locking plate and screws  Discharge Diagnoses: Patient Active Problem List   Diagnosis Date Noted  . Status post open reduction with internal fixation (ORIF) of fracture of ankle 04/06/2020  . Ankle fracture, right 04/06/2020  . GERD (gastroesophageal reflux disease) 05/11/2019  . Irregular menses 05/11/2019  . Obesity (BMI 30.0-34.9) 06/14/2017  . History of anemia 06/14/2017  . Routine general medical examination at a health care facility 06/11/2015  . Migraine 06/11/2015    Past Medical History:  Diagnosis Date  . Amenorrhea   . Cholelithiasis   . GERD (gastroesophageal reflux disease)   . Migraine   . Ovarian cyst      Transfusion:    Consultants (if any):   Discharged Condition: Improved  Hospital Course: Valerie Lindsey is an 45 y.o. female who was admitted 04/06/2020 with a diagnosis of right ankle fracture-dislocation and went to the operating room on 04/06/2020 and underwent ORIF of medial and lateral malleolar fractures. The patient received perioperative antibiotics for prophylaxis (see below). The patient tolerated the procedure well and was  transported to PACU in stable condition. After meeting PACU criteria, the patient was subsequently transferred to the Orthopaedics/Rehabilitation unit.   The patient received DVT prophylaxis in the form of early mobilization, Xarelto. A sacral pad had been placed and heels were elevated off of the bed with rolled towels in order to protect skin integrity. Foley catheter was discontinued on postoperative day #0.  The surgical incision was healing well without signs of infection.  Physical therapy was initiated postoperatively for transfers, gait training, and strengthening. Occupational therapy was initiated for activities of daily living and evaluation for assisted devices. Rehabilitation goals were reviewed in detail with the patient. The patient made steady progress with physical therapy and physical therapy recommended discharge to SNF, but patient declined and instead was discharged  Home.   The patient achieved the preliminary goals of this hospitalization and was felt to be medically and orthopaedically appropriate for discharge.  She was given perioperative antibiotics:  Anti-infectives (From admission, onward)   Start     Dose/Rate Route Frequency Ordered Stop   04/07/20 0600  ceFAZolin (ANCEF) IVPB 2g/100 mL premix        2 g 200 mL/hr over 30 Minutes Intravenous On call to O.R. 04/06/20 1410 04/06/20 1723   04/06/20 2230  ceFAZolin (ANCEF) IVPB 2g/100 mL premix        2 g 200 mL/hr over 30 Minutes Intravenous Every 6 hours 04/06/20 2140 04/07/20 0958   04/06/20 1412  ceFAZolin (ANCEF) 2-4 GM/100ML-% IVPB       Note to Pharmacy: Milinda Cave   :  cabinet override      04/06/20 1412 04/06/20 1725    .  Recent vital signs:  Vitals:   04/07/20 0627 04/07/20 0744  BP: (!) 150/83 132/89  Pulse: 91 95  Resp:  16  Temp: 98.2 F (36.8 C) 98 F (36.7 C)  SpO2: 97% 99%    Recent laboratory studies:  No results for input(s): WBC, HGB, HCT, PLT, K, CL, CO2, BUN, CREATININE,  GLUCOSE, CALCIUM, LABPT, INR in the last 72 hours.  Diagnostic Studies: DG Ankle 2 Views Right  Result Date: 04/06/2020 CLINICAL DATA:  ORIF ankle fracture. EXAM: RIGHT ANKLE - 2 VIEW; DG C-ARM 1-60 MIN COMPARISON:  Preoperative radiograph earlier today. FINDINGS: Three fluoroscopic spot views obtained in the operating room in frontal and lateral projections. Lateral plate and multi screw fixation of distal fibular fracture. Two screws traverse the medial malleolar fracture. Total fluoroscopy time 22 seconds. Dose not provided. IMPRESSION: Intraoperative fluoroscopy during right ankle ORIF. Electronically Signed   By: Keith Rake M.D.   On: 04/06/2020 19:16   DG Ankle 2 Views Right  Result Date: 04/06/2020 CLINICAL DATA:  Status post reduction EXAM: RIGHT ANKLE - 2 VIEW COMPARISON:  04/05/2020 FINDINGS: Previously seen fracture dislocation has been reduced. Splinting material is noted. No acute bony abnormality is seen. IMPRESSION: Reduced trimalleolar fracture when compared with the prior exam. Electronically Signed   By: Inez Catalina M.D.   On: 04/06/2020 01:05   DG Ankle Complete Right  Result Date: 04/05/2020 CLINICAL DATA:  Fall, ankle injury history of fall while roller-skating. EXAM: RIGHT ANKLE - COMPLETE 3+ VIEW; RIGHT FOOT COMPLETE - 3+ VIEW COMPARISON:  None FINDINGS: RIGHT ankle: Fracture dislocation of the RIGHT ankle with fractures of the lateral malleolus, oblique fracture with marked lateral displacement. Lateral translation of the talus with comminuted fracture of the medial malleolus translated laterally as well. Bony detail of the talus limited by overlying casting material. Potential fracture involving the posterior tibia though this is not definite. RIGHT foot: Fractures about the ankle are also noted on imaging of the RIGHT foot. Soft tissue swelling about the RIGHT lower extremity. Phalangeal assessment limited by overlap. IMPRESSION: Fracture dislocation about the RIGHT ankle  with marked lateral translation of the talus, medial and lateral malleolus and potentially associated posterior malleolar trimalleolar fracture, potentially involving the posterolateral corner of the distal tibia. Electronically Signed   By: Zetta Bills M.D.   On: 04/05/2020 21:59   CT Ankle Right Wo Contrast  Result Date: 04/06/2020 CLINICAL DATA:  Mechanical fall while roller-skating EXAM: CT OF THE RIGHT ANKLE WITHOUT CONTRAST TECHNIQUE: Multidetector CT imaging of the right ankle was performed according to the standard protocol. Multiplanar CT image reconstructions were also generated. COMPARISON:  Radiographs 04/05/2020 FINDINGS: Bones/Joint/Cartilage Obliquely oriented transsyndesmotic fracture of the distal fibular (Weber B tiny crescentic radiodensity adjacent the posterior tip of the lateral malleolus is likely small avulsion. Transversely oriented fracture of through the medial malleolus with a second avulsive fracture at the tip of the medial malleolus as well. Vertically oriented, mildly comminuted and displaced fracture of the posterior malleolus. 3 mm of lateral talar shift which given the constellation of additional findings above is compatible with an unstable ankle injury. Rounded, Corticated mineralization situated along the lateral talar margin may reflect an unfused ossification center or an accessory ossicle. Circumferential soft tissue swelling and ankle joint effusion. Few tiny ossific fragments are displaced into the ankle joint. Ligaments Suboptimally assessed by CT. Constellation of findings are compatible with  a Weber B stage IV injury implicating ligamentous instability. Muscles and Tendons No fully retracted or torn tendons is are seen. No large intramuscular hematoma or collection. No tendinous subluxation or displacement. Soft tissues Circumferential soft tissue swelling of the ankle most pronounced adjacent the medial and lateral malleolar fracture lines. Ankle joint effusion,  as above. No soft tissue gas or foreign body. Overlying splinting material is seen. Few benign soft tissue mineralization seen in the anterior soft tissues of the distal lower leg. IMPRESSION: 1. Trimalleolar fracture/dislocation in a pattern compatible with a Weber B stage IV unstable ankle injury with 3 mm of residual lateral talar shift. 2. Additional several avulsive type fractures seen arising from tips of the medial and lateral malleoli. 3. Circumferential soft tissue swelling and ankle joint effusion with several tiny ossific fragments into the ankle joint. 4. Corticated mineralization situated along the lateral talar margin may reflect an unfused ossification center or an accessory ossicle. Electronically Signed   By: Lovena Le M.D.   On: 04/06/2020 03:32   DG Foot Complete Right  Result Date: 04/05/2020 CLINICAL DATA:  Fall, ankle injury history of fall while roller-skating. EXAM: RIGHT ANKLE - COMPLETE 3+ VIEW; RIGHT FOOT COMPLETE - 3+ VIEW COMPARISON:  None FINDINGS: RIGHT ankle: Fracture dislocation of the RIGHT ankle with fractures of the lateral malleolus, oblique fracture with marked lateral displacement. Lateral translation of the talus with comminuted fracture of the medial malleolus translated laterally as well. Bony detail of the talus limited by overlying casting material. Potential fracture involving the posterior tibia though this is not definite. RIGHT foot: Fractures about the ankle are also noted on imaging of the RIGHT foot. Soft tissue swelling about the RIGHT lower extremity. Phalangeal assessment limited by overlap. IMPRESSION: Fracture dislocation about the RIGHT ankle with marked lateral translation of the talus, medial and lateral malleolus and potentially associated posterior malleolar trimalleolar fracture, potentially involving the posterolateral corner of the distal tibia. Electronically Signed   By: Zetta Bills M.D.   On: 04/05/2020 21:59   DG C-Arm 1-60 Min  Result  Date: 04/06/2020 CLINICAL DATA:  ORIF ankle fracture. EXAM: RIGHT ANKLE - 2 VIEW; DG C-ARM 1-60 MIN COMPARISON:  Preoperative radiograph earlier today. FINDINGS: Three fluoroscopic spot views obtained in the operating room in frontal and lateral projections. Lateral plate and multi screw fixation of distal fibular fracture. Two screws traverse the medial malleolar fracture. Total fluoroscopy time 22 seconds. Dose not provided. IMPRESSION: Intraoperative fluoroscopy during right ankle ORIF. Electronically Signed   By: Keith Rake M.D.   On: 04/06/2020 19:16   [place] OR NERVE BLOCK-IMAGE ONLY [facility])  Result Date: 04/06/2020 There is no interpretation for this exam.  This order is for images obtained during a surgical procedure.  Please See "Surgeries" Tab for more information regarding the procedure.    Discharge Medications:   Allergies as of 04/07/2020   No Known Allergies     Medication List    TAKE these medications   acetaminophen 325 MG tablet Commonly known as: TYLENOL Take 650 mg by mouth every 6 (six) hours as needed.   aspirin EC 325 MG tablet Take 1 tablet (325 mg total) by mouth daily.   ibuprofen 600 MG tablet Commonly known as: ADVIL Take 600 mg by mouth every 6 (six) hours as needed.   naproxen sodium 220 MG tablet Commonly known as: ALEVE Take 220 mg by mouth. Notes to patient: Not given in hospital   omeprazole 40 MG capsule Commonly known  as: PRILOSEC TAKE 1 CAPSULE (40 MG TOTAL) BY MOUTH DAILY. Notes to patient: Not given in hospital   oxyCODONE-acetaminophen 5-325 MG tablet Commonly known as: Percocet Take 1 tablet by mouth every 4 (four) hours as needed for severe pain.   promethazine 12.5 MG tablet Commonly known as: PHENERGAN Take 1 tablet (12.5 mg total) by mouth every 6 (six) hours as needed for nausea or vomiting.   SUMAtriptan 100 MG tablet Commonly known as: IMITREX TAKE 1 TABLET AT START OF MIGRAINE. MAY HAVE SECOND DOSE IN 2 HOURS.    venlafaxine XR 37.5 MG 24 hr capsule Commonly known as: EFFEXOR-XR TAKE 2 CAPSULES (75 MG) BY MOUTH DAILY WITH BREAKFAST.            Durable Medical Equipment  (From admission, onward)         Start     Ordered   04/07/20 1232  For home use only DME 3 n 1  Once        04/07/20 1232   04/07/20 1232  For home use only DME Walker  Once       Question:  Patient needs a walker to treat with the following condition  Answer:  Ankle fracture, bimalleolar, closed, right, initial encounter   04/07/20 1232          Disposition: home with home health PT      Follow-up Information    Lattie Corns, PA-C. Schedule an appointment as soon as possible for a visit in 2 week(s).   Specialty: Physician Assistant Contact information: [place] [id number] [place], PA-C 04/07/2020, 12:35 PM

## 2020-04-07 NOTE — Plan of Care (Signed)

## 2020-04-07 NOTE — Progress Notes (Signed)
  Subjective: 1 Day Post-Op Procedure(s) (LRB): OPEN REDUCTION INTERNAL FIXATION (ORIF) ANKLE FRACTURE (Right) Patient reports pain as well-controlled.   Patient is well at this time, but did have significant nausea and vomiting last evening. Plan is to go Home after hospital stay. Negative for chest pain and shortness of breath Fever: no Gastrointestinal: negative for nausea and vomiting.    Objective: Vital signs in last 24 hours: Temp:  [96.8 F (36 C)-98.2 F (36.8 C)] 98 F (36.7 C) (03/12 0744) Pulse Rate:  [87-128] 95 (03/12 0744) Resp:  [6-18] 16 (03/12 0744) BP: (129-155)/(83-101) 132/89 (03/12 0744) SpO2:  [90 %-99 %] 99 % (03/12 0744) Weight:  [104.7 kg] 104.7 kg (03/11 2330)  Intake/Output from previous day:  Intake/Output Summary (Last 24 hours) at 04/07/2020 1220 Last data filed at 04/07/2020 0500 Gross per 24 hour  Intake 2312.22 ml  Output 420 ml  Net 1892.22 ml    Intake/Output this shift: No intake/output data recorded.  Labs: No results for input(s): HGB in the last 72 hours. No results for input(s): WBC, RBC, HCT, PLT in the last 72 hours. No results for input(s): NA, K, CL, CO2, BUN, CREATININE, GLUCOSE, CALCIUM in the last 72 hours. No results for input(s): LABPT, INR in the last 72 hours.   EXAM General - Patient is Alert, Appropriate and Oriented Extremity - post-op splint in place ; sensation decreased but intact over the deep and superficial fibular distributions Motor Function - intact, moving toes well on exam   Assessment/Plan: 1 Day Post-Op Procedure(s) (LRB): OPEN REDUCTION INTERNAL FIXATION (ORIF) ANKLE FRACTURE (Right) Active Problems:   Status post open reduction with internal fixation (ORIF) of fracture of ankle   Ankle fracture, right  Estimated body mass index is 37.27 kg/m as calculated from the following:   Height as of this encounter: 5\' 6"  (1.676 m).   Weight as of this encounter: 104.7 kg. Advance diet Up with  therapy Discharge home with home health  Rx for phenergan provided s/2 nausea/vomiting. All other rx already provided.  DVT Prophylaxis - Lovenox NonWeight-Bearing to right leg  Cassell Smiles, PA-C [facility] Orthopaedic Surgery 04/07/2020, 12:20 PM

## 2020-04-07 NOTE — Anesthesia Postprocedure Evaluation (Signed)
Anesthesia Post Note  Patient: Valerie Lindsey  Procedure(s) Performed: OPEN REDUCTION INTERNAL FIXATION (ORIF) ANKLE FRACTURE (Right Ankle)  Patient location during evaluation: PACU Anesthesia Type: General Level of consciousness: awake and alert Pain management: pain level controlled Vital Signs Assessment: post-procedure vital signs reviewed and stable Respiratory status: spontaneous breathing, nonlabored ventilation, respiratory function stable and patient connected to nasal cannula oxygen Cardiovascular status: blood pressure returned to baseline and stable Postop Assessment: no apparent nausea or vomiting Anesthetic complications: no   No complications documented.   Last Vitals:  Vitals:   04/06/20 2204 04/07/20 0033  BP: (!) 149/86 137/88  Pulse: 92 97  Resp: 17 16  Temp: 36.5 C 36.4 C  SpO2: 95% 95%    Last Pain:  Vitals:   04/06/20 2050  PainSc: 0-No pain                 Arita Miss

## 2020-04-07 NOTE — Progress Notes (Signed)
Patient is stable and ready for discharge home. Patient's IV removed. Writer went over discharge paperwork with patient and she verbalized understanding. Patient was sent home with 3 in 1 and RW.  Patient's belongings packed by her husband and he assisted with getting patient dressed. Patient transported via [facility] to husband track by Probation officer.

## 2020-04-07 NOTE — Evaluation (Signed)
Physical Therapy Evaluation Patient Details Name: Valerie Lindsey MRN: [id number] DOB: 1975/10/02 Today's Date: 04/07/2020   History of Present Illness  Pt is a 45 y/o f who presents via EMS with a cc of R ankle injury after tripping while roller skating just PTA. Patient fell while dancing on skates and reports "my ankle was turned out the wrong way". R ankle deformity noted.  Clinical Impression  Pt admitted with above diagnosis. Pt currently with functional limitations due to the deficits listed below (see "PT Problem List"). Upon entry, pt in bed, awake and agreeable to participate. The pt is alert, pleasant, interactive, and able to provide info regarding prior level of function, both in tolerance and independence. Patient's performance this date reveals decreased ability, independence, and tolerance in performing all basic mobility required for performance of activities of daily living. Pt requires additional DME, close physical assistance, and cues for safe participate in mobility. Pt is able to AMB 164ft c RW without any LOB and she maintains NWB throughout with ease. Pt educated on precautions and safe use of RW at home. Pt will benefit from skilled PT intervention to increase independence and safety with basic mobility in preparation for discharge to the venue listed below.       Follow Up Recommendations Follow surgeon's recommendation for DC plan and follow-up therapies;Supervision for mobility/OOB    Equipment Recommendations  Rolling walker with 5" wheels;3in1 (PT)    Recommendations for Other Services       Precautions / Restrictions Precautions Precautions: None Restrictions Weight Bearing Restrictions: Yes RLE Weight Bearing: Non weight bearing      Mobility  Bed Mobility Overal bed mobility: Independent                  Transfers Overall transfer level: Needs assistance Equipment used: Rolling walker (2 wheeled) Transfers: Sit to/from [employer]  Transfers Sit to Stand: Modified independent (Device/Increase time) Stand pivot transfers: Supervision          Ambulation/Gait Ambulation/Gait assistance: Supervision Gait Distance (Feet): 100 Feet Assistive device: Rolling walker (2 wheeled) Gait Pattern/deviations: WFL(Within Functional Limits);Step-to pattern     General Gait Details: hop-to gait with NWB; good velocity, minimal fatigue  Stairs            Wheelchair Mobility    Modified Rankin (Stroke Patients Only)       Balance Overall balance assessment: Modified Independent                                           Pertinent Vitals/Pain Pain Assessment:  (reports none due to popliteal block)    Home Living Family/patient expects to be discharged to:: Private residence Living Arrangements: Spouse/significant other;Children (8yo DTR) Available Help at Discharge: Family (MIL in area, parents in [place]) Type of Home: House Home Access: Stairs to enter   Technical brewer of Steps: 1 partial stepf Home Layout: One level Home Equipment: None Additional Comments: Given crutches in ED    Prior Function Level of Independence: Independent               Hand Dominance   Dominant Hand: Right    Extremity/Trunk Assessment                Communication      Cognition Arousal/Alertness: Awake/alert Behavior During Therapy: WFL for tasks assessed/performed Overall Cognitive Status: Within Functional Limits  for tasks assessed                                        General Comments      Exercises     Assessment/Plan    PT Assessment Patient needs continued PT services  PT Problem List Decreased strength;Decreased activity tolerance;Decreased mobility       PT Treatment Interventions DME instruction;Balance training;Gait training;Stair training;Functional mobility training;Patient/family education;Therapeutic activities;Therapeutic exercise     PT Goals (Current goals can be found in the Care Plan section)  Acute Rehab PT Goals Patient Stated Goal: return to home soon PT Goal Formulation: With patient Time For Goal Achievement: 04/21/20 Potential to Achieve Goals: Good    Frequency 7X/week   Barriers to discharge        Co-evaluation               AM-PAC PT "6 Clicks" Mobility  Outcome Measure Help needed turning from your back to your side while in a flat bed without using bedrails?: None Help needed moving from lying on your back to sitting on the side of a flat bed without using bedrails?: None Help needed moving to and from a bed to a chair (including a wheelchair)?: None Help needed standing up from a chair using your arms (e.g., wheelchair or bedside chair)?: None Help needed to walk in hospital room?: A Little Help needed climbing 3-5 steps with a railing? : A Little 6 Click Score: 22    End of Session Equipment Utilized During Treatment: Gait belt Activity Tolerance: Patient tolerated treatment well;No increased pain Patient left: in chair;with call bell/phone within reach Nurse Communication: Mobility status PT Visit Diagnosis: Difficulty in walking, not elsewhere classified (R26.2);Other abnormalities of gait and mobility (R26.89)    Time: 7001-7494 PT Time Calculation (min) (ACUTE ONLY): 29 min   Charges:   PT Evaluation $PT Eval Low Complexity: 1 Low PT Treatments $Gait Training: 8-22 mins        11:55 AM, 04/07/20 Etta Grandchild, PT, DPT Physical Therapist - [facility]  [phone] ([place])    [place] 04/07/2020, 11:54 AM

## 2020-04-09 ENCOUNTER — Encounter: Payer: Self-pay | Admitting: Surgery

## 2020-04-18 ENCOUNTER — Other Ambulatory Visit: Payer: Self-pay | Admitting: Student

## 2020-04-18 DIAGNOSIS — S82851D Displaced trimalleolar fracture of right lower leg, subsequent encounter for closed fracture with routine healing: Secondary | ICD-10-CM | POA: Diagnosis not present

## 2020-04-18 DIAGNOSIS — Z8781 Personal history of (healed) traumatic fracture: Secondary | ICD-10-CM | POA: Diagnosis not present

## 2020-04-18 DIAGNOSIS — Z9889 Other specified postprocedural states: Secondary | ICD-10-CM | POA: Diagnosis not present

## 2020-05-18 DIAGNOSIS — Z20822 Contact with and (suspected) exposure to covid-19: Secondary | ICD-10-CM | POA: Diagnosis not present

## 2020-05-18 DIAGNOSIS — R509 Fever, unspecified: Secondary | ICD-10-CM | POA: Diagnosis not present

## 2020-05-18 DIAGNOSIS — J3089 Other allergic rhinitis: Secondary | ICD-10-CM | POA: Diagnosis not present

## 2020-05-21 DIAGNOSIS — S82851D Displaced trimalleolar fracture of right lower leg, subsequent encounter for closed fracture with routine healing: Secondary | ICD-10-CM | POA: Diagnosis not present

## 2020-05-23 ENCOUNTER — Other Ambulatory Visit: Payer: Self-pay

## 2020-05-23 DIAGNOSIS — M25671 Stiffness of right ankle, not elsewhere classified: Secondary | ICD-10-CM | POA: Diagnosis not present

## 2020-05-23 DIAGNOSIS — Z9889 Other specified postprocedural states: Secondary | ICD-10-CM | POA: Diagnosis not present

## 2020-05-23 DIAGNOSIS — M25571 Pain in right ankle and joints of right foot: Secondary | ICD-10-CM | POA: Diagnosis not present

## 2020-05-23 DIAGNOSIS — M6281 Muscle weakness (generalized): Secondary | ICD-10-CM | POA: Diagnosis not present

## 2020-05-23 MED FILL — Venlafaxine HCl Cap ER 24HR 37.5 MG (Base Equivalent): ORAL | 30 days supply | Qty: 60 | Fill #0 | Status: AC

## 2020-05-29 ENCOUNTER — Other Ambulatory Visit: Payer: Self-pay

## 2020-05-29 DIAGNOSIS — M25571 Pain in right ankle and joints of right foot: Secondary | ICD-10-CM | POA: Diagnosis not present

## 2020-05-29 DIAGNOSIS — Z9889 Other specified postprocedural states: Secondary | ICD-10-CM | POA: Diagnosis not present

## 2020-05-29 DIAGNOSIS — M25671 Stiffness of right ankle, not elsewhere classified: Secondary | ICD-10-CM | POA: Diagnosis not present

## 2020-05-29 DIAGNOSIS — M6281 Muscle weakness (generalized): Secondary | ICD-10-CM | POA: Diagnosis not present

## 2020-05-29 MED ORDER — CEPHALEXIN 500 MG PO CAPS
ORAL_CAPSULE | ORAL | 0 refills | Status: DC
  Filled 2020-05-29: qty 40, 10d supply, fill #0

## 2020-06-01 DIAGNOSIS — Z9889 Other specified postprocedural states: Secondary | ICD-10-CM | POA: Diagnosis not present

## 2020-06-01 DIAGNOSIS — M6281 Muscle weakness (generalized): Secondary | ICD-10-CM | POA: Diagnosis not present

## 2020-06-01 DIAGNOSIS — M25671 Stiffness of right ankle, not elsewhere classified: Secondary | ICD-10-CM | POA: Diagnosis not present

## 2020-06-01 DIAGNOSIS — M25571 Pain in right ankle and joints of right foot: Secondary | ICD-10-CM | POA: Diagnosis not present

## 2020-06-05 DIAGNOSIS — M25671 Stiffness of right ankle, not elsewhere classified: Secondary | ICD-10-CM | POA: Diagnosis not present

## 2020-06-05 DIAGNOSIS — Z9889 Other specified postprocedural states: Secondary | ICD-10-CM | POA: Diagnosis not present

## 2020-06-05 DIAGNOSIS — M6281 Muscle weakness (generalized): Secondary | ICD-10-CM | POA: Diagnosis not present

## 2020-06-05 DIAGNOSIS — M25571 Pain in right ankle and joints of right foot: Secondary | ICD-10-CM | POA: Diagnosis not present

## 2020-06-08 DIAGNOSIS — Z9889 Other specified postprocedural states: Secondary | ICD-10-CM | POA: Diagnosis not present

## 2020-06-08 DIAGNOSIS — M25671 Stiffness of right ankle, not elsewhere classified: Secondary | ICD-10-CM | POA: Diagnosis not present

## 2020-06-08 DIAGNOSIS — M25571 Pain in right ankle and joints of right foot: Secondary | ICD-10-CM | POA: Diagnosis not present

## 2020-06-08 DIAGNOSIS — M6281 Muscle weakness (generalized): Secondary | ICD-10-CM | POA: Diagnosis not present

## 2020-06-13 ENCOUNTER — Other Ambulatory Visit: Payer: Self-pay

## 2020-06-13 MED ORDER — MUPIROCIN 2 % EX OINT
TOPICAL_OINTMENT | CUTANEOUS | 2 refills | Status: DC
  Filled 2020-06-13: qty 22, 7d supply, fill #0

## 2020-06-15 DIAGNOSIS — M25671 Stiffness of right ankle, not elsewhere classified: Secondary | ICD-10-CM | POA: Diagnosis not present

## 2020-06-15 DIAGNOSIS — M6281 Muscle weakness (generalized): Secondary | ICD-10-CM | POA: Diagnosis not present

## 2020-06-15 DIAGNOSIS — M25571 Pain in right ankle and joints of right foot: Secondary | ICD-10-CM | POA: Diagnosis not present

## 2020-06-15 DIAGNOSIS — Z9889 Other specified postprocedural states: Secondary | ICD-10-CM | POA: Diagnosis not present

## 2020-06-20 DIAGNOSIS — M25671 Stiffness of right ankle, not elsewhere classified: Secondary | ICD-10-CM | POA: Diagnosis not present

## 2020-06-20 DIAGNOSIS — Z9889 Other specified postprocedural states: Secondary | ICD-10-CM | POA: Diagnosis not present

## 2020-06-20 DIAGNOSIS — M25571 Pain in right ankle and joints of right foot: Secondary | ICD-10-CM | POA: Diagnosis not present

## 2020-06-20 DIAGNOSIS — M6281 Muscle weakness (generalized): Secondary | ICD-10-CM | POA: Diagnosis not present

## 2020-06-22 ENCOUNTER — Other Ambulatory Visit: Payer: Self-pay

## 2020-06-22 MED FILL — Venlafaxine HCl Cap ER 24HR 37.5 MG (Base Equivalent): ORAL | 30 days supply | Qty: 60 | Fill #1 | Status: AC

## 2020-07-23 ENCOUNTER — Other Ambulatory Visit: Payer: Self-pay | Admitting: Family Medicine

## 2020-07-23 MED FILL — Sumatriptan Succinate Tab 100 MG: ORAL | 30 days supply | Qty: 9 | Fill #0 | Status: AC

## 2020-07-24 ENCOUNTER — Other Ambulatory Visit: Payer: Self-pay | Admitting: Family Medicine

## 2020-07-24 ENCOUNTER — Other Ambulatory Visit: Payer: Self-pay

## 2020-07-24 MED FILL — Omeprazole Cap Delayed Release 40 MG: ORAL | 90 days supply | Qty: 90 | Fill #0 | Status: AC

## 2020-07-24 MED FILL — Venlafaxine HCl Cap ER 24HR 37.5 MG (Base Equivalent): ORAL | 30 days supply | Qty: 60 | Fill #0 | Status: AC

## 2020-07-25 ENCOUNTER — Other Ambulatory Visit: Payer: Self-pay

## 2020-07-31 ENCOUNTER — Other Ambulatory Visit: Payer: Self-pay

## 2020-08-10 DIAGNOSIS — S82851D Displaced trimalleolar fracture of right lower leg, subsequent encounter for closed fracture with routine healing: Secondary | ICD-10-CM | POA: Diagnosis not present

## 2020-08-24 ENCOUNTER — Other Ambulatory Visit: Payer: Self-pay

## 2020-08-24 MED FILL — Venlafaxine HCl Cap ER 24HR 37.5 MG (Base Equivalent): ORAL | 30 days supply | Qty: 60 | Fill #1 | Status: AC

## 2020-09-26 ENCOUNTER — Other Ambulatory Visit: Payer: Self-pay

## 2020-09-26 MED FILL — Venlafaxine HCl Cap ER 24HR 37.5 MG (Base Equivalent): ORAL | 30 days supply | Qty: 60 | Fill #2 | Status: AC

## 2020-10-25 ENCOUNTER — Other Ambulatory Visit: Payer: Self-pay

## 2020-10-25 MED FILL — Omeprazole Cap Delayed Release 40 MG: ORAL | 90 days supply | Qty: 90 | Fill #1 | Status: AC

## 2020-10-25 MED FILL — Venlafaxine HCl Cap ER 24HR 37.5 MG (Base Equivalent): ORAL | 30 days supply | Qty: 60 | Fill #3 | Status: AC

## 2020-10-25 MED FILL — Omeprazole Cap Delayed Release 40 MG: ORAL | 90 days supply | Qty: 90 | Fill #1 | Status: CN

## 2020-10-26 ENCOUNTER — Other Ambulatory Visit: Payer: Self-pay | Admitting: Family Medicine

## 2020-10-26 ENCOUNTER — Other Ambulatory Visit: Payer: Self-pay

## 2020-10-26 MED ORDER — SUMATRIPTAN SUCCINATE 100 MG PO TABS
ORAL_TABLET | ORAL | 5 refills | Status: DC
  Filled 2020-10-26: qty 9, 30d supply, fill #0
  Filled 2021-03-26: qty 9, 30d supply, fill #1
  Filled 2021-07-21: qty 9, 30d supply, fill #2

## 2020-10-26 MED FILL — Tizanidine HCl Tab 4 MG (Base Equivalent): ORAL | 10 days supply | Qty: 30 | Fill #0 | Status: AC

## 2020-11-26 ENCOUNTER — Encounter: Payer: 59 | Admitting: Family Medicine

## 2020-11-26 ENCOUNTER — Other Ambulatory Visit: Payer: Self-pay | Admitting: Family Medicine

## 2020-11-26 ENCOUNTER — Other Ambulatory Visit: Payer: Self-pay

## 2020-11-27 ENCOUNTER — Other Ambulatory Visit: Payer: Self-pay | Admitting: Family Medicine

## 2020-11-27 ENCOUNTER — Other Ambulatory Visit: Payer: Self-pay

## 2020-11-27 MED FILL — Venlafaxine HCl Cap ER 24HR 37.5 MG (Base Equivalent): ORAL | 30 days supply | Qty: 60 | Fill #0 | Status: AC

## 2020-11-28 ENCOUNTER — Ambulatory Visit (INDEPENDENT_AMBULATORY_CARE_PROVIDER_SITE_OTHER): Payer: 59 | Admitting: Family Medicine

## 2020-11-28 ENCOUNTER — Encounter: Payer: Self-pay | Admitting: Family Medicine

## 2020-11-28 ENCOUNTER — Other Ambulatory Visit: Payer: Self-pay

## 2020-11-28 VITALS — BP 130/80 | HR 88 | Temp 98.8°F | Ht 65.5 in | Wt 214.8 lb

## 2020-11-28 DIAGNOSIS — G43709 Chronic migraine without aura, not intractable, without status migrainosus: Secondary | ICD-10-CM | POA: Diagnosis not present

## 2020-11-28 DIAGNOSIS — Z1329 Encounter for screening for other suspected endocrine disorder: Secondary | ICD-10-CM | POA: Diagnosis not present

## 2020-11-28 DIAGNOSIS — Z13220 Encounter for screening for lipoid disorders: Secondary | ICD-10-CM | POA: Diagnosis not present

## 2020-11-28 DIAGNOSIS — Z1211 Encounter for screening for malignant neoplasm of colon: Secondary | ICD-10-CM

## 2020-11-28 DIAGNOSIS — E669 Obesity, unspecified: Secondary | ICD-10-CM | POA: Diagnosis not present

## 2020-11-28 DIAGNOSIS — H5789 Other specified disorders of eye and adnexa: Secondary | ICD-10-CM | POA: Diagnosis not present

## 2020-11-28 DIAGNOSIS — Z124 Encounter for screening for malignant neoplasm of cervix: Secondary | ICD-10-CM

## 2020-11-28 DIAGNOSIS — Z0000 Encounter for general adult medical examination without abnormal findings: Secondary | ICD-10-CM | POA: Diagnosis not present

## 2020-11-28 NOTE — Assessment & Plan Note (Signed)
The patient notes these are very well controlled on the Effexor.  She is having minimal issues with her migraines.

## 2020-11-28 NOTE — Patient Instructions (Signed)
Nice to see you. We will get lab work today. I placed a referral to gynecology, GI, and ophthalmology. Please try to increase your walking. Please try to take healthier lunches and reduce your soda intake.

## 2020-11-28 NOTE — Progress Notes (Signed)
Tommi Rumps, MD Phone: [phone]  Valerie Lindsey is a 45 y.o. female who presents today for CPE.  Diet: eats lunch at the cafeteria, plans to start taking her lunches, 3-4 diet sodas per day Exercise: walking some, somewhat limited by her prior ankle fracture resulting in some stiffness, swelling and intermittent numbness in her left foot Pap smear: due Colonoscopy: due Mammogram: UTD Family history-  Colon cancer: no  Breast cancer: no  Ovarian cancer: no Menses: 1x/month 3-4 days Vaccines-   Flu: UTD  Tetanus: UTD  COVID19: x3, due for bivalent booster HIV screening: declined previously Hep C Screening: declined previously Tobacco use: no Alcohol use: rare Illicit Drug use: no Dentist: yes Optometry: has not seen in some time, does have prolapsed orbital fat   Active Ambulatory Problems    Diagnosis Date Noted   Routine general medical examination at a health care facility 06/11/2015   Migraine 06/11/2015   Obesity (BMI 30.0-34.9) 06/14/2017   History of anemia 06/14/2017   GERD (gastroesophageal reflux disease) 05/11/2019   Irregular menses 05/11/2019   Status post open reduction with internal fixation (ORIF) of fracture of ankle 04/06/2020   Ankle fracture, right 04/06/2020   Herniation of orbital fat 11/28/2020   Resolved Ambulatory Problems    Diagnosis Date Noted   Overweight (BMI 25.0-29.9) 11/21/2016   Past Medical History:  Diagnosis Date   Amenorrhea    Cholelithiasis    Ovarian cyst     Family History  Problem Relation Age of Onset   Prostate cancer Father    Heart disease Father    Hyperlipidemia Mother    Heart disease Mother    Hypertension Mother    Stroke Paternal Grandmother    Breast cancer Neg Hx     Social History   Socioeconomic History   Marital status: Married    Spouse name: Not on file   Number of children: Not on file   Years of education: Not on file   Highest education level: Not on file  Occupational History    Not on file  Tobacco Use   Smoking status: Never   Smokeless tobacco: Never  Vaping Use   Vaping Use: Never used  Substance and Sexual Activity   Alcohol use: No    Alcohol/week: 0.0 standard drinks   Drug use: No   Sexual activity: Yes    Birth control/protection: None  Other Topics Concern   Not on file  Social History Narrative   Not on file   Social Determinants of Health   Financial Resource Strain: Not on file  Food Insecurity: Not on file  Transportation Needs: Not on file  Physical Activity: Not on file  Stress: Not on file  Social Connections: Not on file  Intimate Partner Violence: Not on file    ROS  General:  Negative for nexplained weight loss, fever Skin: Negative for new or changing mole, sore that won't heal HEENT: Negative for trouble hearing, trouble seeing, ringing in ears, mouth sores, hoarseness, change in voice, dysphagia. CV:  Negative for chest pain, dyspnea, edema, palpitations Resp: Negative for cough, dyspnea, hemoptysis GI: Negative for nausea, vomiting, diarrhea, constipation, abdominal pain, melena, hematochezia. GU: Negative for dysuria, incontinence, urinary hesitance, hematuria, vaginal or penile discharge, polyuria, sexual difficulty, lumps in testicle or breasts MSK: Negative for muscle cramps or aches, joint pain or swelling Neuro: Negative for headaches, weakness, numbness, dizziness, passing out/fainting Psych: Negative for depression, anxiety, memory problems  Objective  Physical Exam Vitals:  11/28/20 1451  BP: 130/80  Pulse: 88  Temp: 98.8 F (37.1 C)  SpO2: 98%    BP Readings from Last 3 Encounters:  11/28/20 130/80  04/07/20 132/89  04/06/20 137/72   Wt Readings from Last 3 Encounters:  11/28/20 214 lb 12.8 oz (97.4 kg)  04/06/20 230 lb 14.4 oz (104.7 kg)  04/05/20 225 lb 1.6 oz (102.1 kg)    Physical Exam Constitutional:      General: She is not in acute distress.    Appearance: She is not diaphoretic.   Eyes:     Pupils: Pupils are equal, round, and reactive to light.     Comments: Right eye with apparent prolapsed fat at lateral corner of the eye  Cardiovascular:     Rate and Rhythm: Normal rate and regular rhythm.     Heart sounds: Normal heart sounds.  Pulmonary:     Effort: Pulmonary effort is normal.     Breath sounds: Normal breath sounds.  Abdominal:     General: Bowel sounds are normal. There is no distension.     Palpations: Abdomen is soft.     Tenderness: There is no abdominal tenderness.  Musculoskeletal:     Right lower leg: No edema.     Left lower leg: No edema.  Lymphadenopathy:     Cervical: No cervical adenopathy.  Skin:    General: Skin is warm and dry.  Neurological:     Mental Status: She is alert.  Psychiatric:        Mood and Affect: Mood normal.     Assessment/Plan:   Problem List Items Addressed This Visit     Herniation of orbital fat    Refer to ophthalmology.      Relevant Orders   Ambulatory referral to Ophthalmology   Migraine    The patient notes these are very well controlled on the Effexor.  She is having minimal issues with her migraines.      Routine general medical examination at a health care facility - Primary    Physical exam completed.  I encouraged her to build up her walking after having recovered from her ankle.  She reports surgery released her to resume activities.  I also discussed healthier lunch choices and reducing soda intake.  She will be referred to gynecology for Pap smear as she has not contacted them herself.  We will refer her for a colonoscopy.  I encouraged her to get the updated COVID booster.  Lab work as outlined.      Other Visit Diagnoses     Cervical cancer screening       Relevant Orders   Ambulatory referral to Gynecology   Colon cancer screening       Relevant Orders   Ambulatory referral to Gastroenterology   Lipid screening       Relevant Orders   Comp Met (CMET)   Lipid panel   Obesity  (BMI 35.0-39.9 without comorbidity)       Relevant Orders   HgB A1c   Thyroid disorder screen       Relevant Orders   TSH       Return in about 1 year (around 11/28/2021) for CPE.  This visit occurred during the SARS-CoV-2 public health emergency.  Safety protocols were in place, including screening questions prior to the visit, additional usage of staff PPE, and extensive cleaning of exam room while observing appropriate contact time as indicated for disinfecting solutions.    Tommi Rumps,  MD [place]

## 2020-11-28 NOTE — Assessment & Plan Note (Signed)
Physical exam completed.  I encouraged her to build up her walking after having recovered from her ankle.  She reports surgery released her to resume activities.  I also discussed healthier lunch choices and reducing soda intake.  She will be referred to gynecology for Pap smear as she has not contacted them herself.  We will refer her for a colonoscopy.  I encouraged her to get the updated COVID booster.  Lab work as outlined.

## 2020-11-28 NOTE — Assessment & Plan Note (Signed)
- 

## 2020-11-29 ENCOUNTER — Telehealth: Payer: Self-pay

## 2020-11-29 LAB — COMPREHENSIVE METABOLIC PANEL
ALT: 12 U/L (ref 0–35)
AST: 15 U/L (ref 0–37)
Albumin: 4.3 g/dL (ref 3.5–5.2)
Alkaline Phosphatase: 77 U/L (ref 39–117)
BUN: 17 mg/dL (ref 6–23)
CO2: 27 mEq/L (ref 19–32)
Calcium: 9.6 mg/dL (ref 8.4–10.5)
Chloride: 103 mEq/L (ref 96–112)
Creatinine, Ser: 0.75 mg/dL (ref 0.40–1.20)
GFR: 96.25 mL/min (ref >60.00)
Glucose, Bld: 88 mg/dL (ref 70–99)
Potassium: 4.1 mEq/L (ref 3.5–5.1)
Sodium: 137 mEq/L (ref 135–145)
Total Bilirubin: 0.2 mg/dL (ref 0.2–1.2)
Total Protein: 6.7 g/dL (ref 6.0–8.3)

## 2020-11-29 LAB — TSH: TSH: 0.51 u[IU]/mL (ref 0.35–5.50)

## 2020-11-29 LAB — LDL CHOLESTEROL, DIRECT: Direct LDL: 144 mg/dL

## 2020-11-29 LAB — HEMOGLOBIN A1C: Hgb A1c MFr Bld: 5.8 % (ref 4.6–6.5)

## 2020-11-29 LAB — LIPID PANEL
Cholesterol: 220 mg/dL — ABNORMAL HIGH (ref 0–200)
HDL: 47.8 mg/dL (ref >39.00)
NonHDL: 171.71
Total CHOL/HDL Ratio: 5
Triglycerides: 255 mg/dL — ABNORMAL HIGH (ref 0.0–149.0)
VLDL: 51 mg/dL — ABNORMAL HIGH (ref 0.0–40.0)

## 2020-11-29 NOTE — Telephone Encounter (Signed)
[facility] referring for Cervical cancer screening/ nexplanon replacement for 01/09/21 at 2:30 with AMS

## 2020-12-10 ENCOUNTER — Telehealth: Payer: Self-pay | Admitting: Gastroenterology

## 2020-12-10 NOTE — Telephone Encounter (Signed)
Referral is in the [place].

## 2020-12-10 NOTE — Telephone Encounter (Signed)
Pt is requesting Dr. Vicente Males

## 2020-12-10 NOTE — Telephone Encounter (Signed)
Inbound call from pt requesting to schedule a colonoscopy.

## 2020-12-17 ENCOUNTER — Telehealth: Payer: Self-pay

## 2020-12-17 NOTE — Telephone Encounter (Signed)
Patient is ready to schedule procedure. Clinical staff will follow up with patient. °

## 2020-12-25 ENCOUNTER — Other Ambulatory Visit: Payer: Self-pay

## 2020-12-25 MED FILL — Venlafaxine HCl Cap ER 24HR 37.5 MG (Base Equivalent): ORAL | 30 days supply | Qty: 60 | Fill #1 | Status: AC

## 2020-12-26 ENCOUNTER — Telehealth: Payer: Self-pay

## 2020-12-26 NOTE — Telephone Encounter (Signed)
Inbound call from pt returning a phone call to schedule colonoscopy.

## 2020-12-26 NOTE — Telephone Encounter (Signed)
Letter sent out 

## 2021-01-03 NOTE — Telephone Encounter (Signed)
Inbound call from pt calling to schedule her procedure

## 2021-01-09 ENCOUNTER — Encounter: Payer: 59 | Admitting: Obstetrics and Gynecology

## 2021-01-22 ENCOUNTER — Other Ambulatory Visit: Payer: Self-pay

## 2021-01-22 MED ORDER — NA SULFATE-K SULFATE-MG SULF 17.5-3.13-1.6 GM/177ML PO SOLN
1.0000 | Freq: Once | ORAL | 0 refills | Status: AC
  Filled 2021-01-22: qty 354, fill #0
  Filled 2021-01-29: qty 354, 1d supply, fill #0

## 2021-01-23 ENCOUNTER — Other Ambulatory Visit: Payer: Self-pay

## 2021-01-23 ENCOUNTER — Other Ambulatory Visit: Payer: Self-pay | Admitting: Family Medicine

## 2021-01-23 MED ORDER — OMEPRAZOLE 40 MG PO CPDR
DELAYED_RELEASE_CAPSULE | Freq: Every day | ORAL | 1 refills | Status: DC
  Filled 2021-01-23: qty 90, 90d supply, fill #0
  Filled 2021-04-26: qty 90, 90d supply, fill #1

## 2021-01-23 MED FILL — Venlafaxine HCl Cap ER 24HR 37.5 MG (Base Equivalent): ORAL | 30 days supply | Qty: 60 | Fill #2 | Status: AC

## 2021-01-29 ENCOUNTER — Other Ambulatory Visit: Payer: Self-pay

## 2021-01-31 ENCOUNTER — Encounter: Payer: Self-pay | Admitting: Obstetrics & Gynecology

## 2021-01-31 ENCOUNTER — Other Ambulatory Visit (HOSPITAL_COMMUNITY)
Admission: RE | Admit: 2021-01-31 | Discharge: 2021-01-31 | Disposition: A | Discharge status: Home / Self Care | Payer: 59 | Source: Ambulatory Visit | Attending: Obstetrics & Gynecology | Admitting: Obstetrics & Gynecology

## 2021-01-31 ENCOUNTER — Ambulatory Visit (INDEPENDENT_AMBULATORY_CARE_PROVIDER_SITE_OTHER): Payer: 59 | Admitting: Obstetrics & Gynecology

## 2021-01-31 ENCOUNTER — Other Ambulatory Visit: Payer: Self-pay

## 2021-01-31 VITALS — BP 120/80 | Ht 66.0 in | Wt 212.0 lb

## 2021-01-31 DIAGNOSIS — Z124 Encounter for screening for malignant neoplasm of cervix: Secondary | ICD-10-CM | POA: Diagnosis not present

## 2021-01-31 DIAGNOSIS — Z30017 Encounter for initial prescription of implantable subdermal contraceptive: Secondary | ICD-10-CM

## 2021-01-31 DIAGNOSIS — Z01419 Encounter for gynecological examination (general) (routine) without abnormal findings: Secondary | ICD-10-CM

## 2021-01-31 NOTE — Progress Notes (Signed)
HPI:      [name] is a 46 y.o. G1P1001 who LMP was No LMP recorded., she presents today for her annual examination. The patient has no complaints today. The patient is sexually active. Her last pap: approximate date 2018 and was normal and last mammogram: approximate date 2022 and was normal. The patient does perform self breast exams.  There is no notable family history of breast or ovarian cancer in her family.  The patient has regular exercise: yes.  The patient denies current symptoms of depression.    GYN History: Contraception: Nexplanon  PMHx: Past Medical History:  Diagnosis Date   Amenorrhea    Cholelithiasis    GERD (gastroesophageal reflux disease)    Migraine    Ovarian cyst    Past Surgical History:  Procedure Laterality Date   CHOLECYSTECTOMY  2014   ORIF ANKLE FRACTURE Right 04/06/2020   Procedure: OPEN REDUCTION INTERNAL FIXATION (ORIF) ANKLE FRACTURE;  Surgeon: Corky Mull, MD;  Location: [facility] ORS;  Service: Orthopedics;  Laterality: Right;   Family History  Problem Relation Age of Onset   Prostate cancer Father    Heart disease Father    Hyperlipidemia Mother    Heart disease Mother    Hypertension Mother    Stroke Paternal Grandmother    Breast cancer Neg Hx    Social History   Tobacco Use   Smoking status: Never   Smokeless tobacco: Never  Vaping Use   Vaping Use: Never used  Substance Use Topics   Alcohol use: No    Alcohol/week: 0.0 standard drinks   Drug use: No    Current Outpatient Medications:    omeprazole (PRILOSEC) 40 MG capsule, TAKE 1 CAPSULE (40 MG TOTAL) BY MOUTH DAILY., Disp: 90 capsule, Rfl: 1   venlafaxine XR (EFFEXOR-XR) 37.5 MG 24 hr capsule, TAKE 2 CAPSULES (75 MG) BY MOUTH DAILY WITH BREAKFAST., Disp: 60 capsule, Rfl: 3   acetaminophen (TYLENOL) 325 MG tablet, Take 650 mg by mouth every 6 (six) hours as needed., Disp: , Rfl:    aspirin EC 325 MG tablet, Take 1 tablet (325 mg total) by mouth daily., Disp: 21  tablet, Rfl: 0   cephALEXin (KEFLEX) 500 MG capsule, Take 1 capsule (500 mg total) by mouth 4 (four) times daily for 10 days, Disp: 40 capsule, Rfl: 0   ibuprofen (ADVIL) 600 MG tablet, Take 600 mg by mouth every 6 (six) hours as needed., Disp: , Rfl:    mupirocin ointment (BACTROBAN) 2 %, Apply topically 3 (three) times daily for 7 days, Disp: 22 g, Rfl: 2   naproxen sodium (ALEVE) 220 MG tablet, Take 220 mg by mouth., Disp: , Rfl:    ondansetron (ZOFRAN-ODT) 4 MG disintegrating tablet, DISSOLVE 1 TABLET (4 MG TOTAL) BY MOUTH EVERY 8 (EIGHT) HOURS AS NEEDED FOR NAUSEA FOR UP TO 7 DAYS, Disp: 20 tablet, Rfl: 1   promethazine (PHENERGAN) 12.5 MG tablet, Take 1 tablet (12.5 mg total) by mouth every 6 (six) hours as needed for nausea or vomiting., Disp: 30 tablet, Rfl: 0   SUMAtriptan (IMITREX) 100 MG tablet, TAKE 1 TABLET AT START OF MIGRAINE. MAY HAVE SECOND DOSE IN 2 HOURS. (Patient not taking: Reported on 01/31/2021), Disp: 9 tablet, Rfl: 5   tiZANidine (ZANAFLEX) 4 MG tablet, TAKE 1 TABLET BY MOUTH 3 TIMES DAILY AS NEEDED, Disp: 30 tablet, Rfl: 1 Allergies: Patient has no known allergies.  Review of Systems  Constitutional:  Negative for chills, fever and  malaise/fatigue.  HENT:  Negative for congestion, sinus pain and sore throat.   Eyes:  Negative for blurred vision and pain.  Respiratory:  Negative for cough and wheezing.   Cardiovascular:  Negative for chest pain and leg swelling.  Gastrointestinal:  Negative for abdominal pain, constipation, diarrhea, heartburn, nausea and vomiting.  Genitourinary:  Negative for dysuria, frequency, hematuria and urgency.  Musculoskeletal:  Negative for back pain, joint pain, myalgias and neck pain.  Skin:  Negative for itching and rash.  Neurological:  Negative for dizziness, tremors and weakness.  Endo/Heme/Allergies:  Does not bruise/bleed easily.  Psychiatric/Behavioral:  Negative for depression. The patient is not nervous/anxious and does not have  insomnia.    Objective: BP 120/80    Ht 5\' 6"  (1.676 m)    Wt 212 lb (96.2 kg)    BMI 34.22 kg/m   Filed Weights   01/31/21 1401  Weight: 212 lb (96.2 kg)   Body mass index is 34.22 kg/m. Physical Exam Constitutional:      General: She is not in acute distress.    Appearance: She is well-developed.  Genitourinary:     Bladder, rectum and urethral meatus normal.     No lesions in the vagina.     Right Labia: No rash, tenderness or lesions.    Left Labia: No tenderness, lesions or rash.    No vaginal bleeding.      Right Adnexa: not tender and no mass present.    Left Adnexa: not tender and no mass present.    No cervical motion tenderness, friability, lesion or polyp.     Uterus is not enlarged.     No uterine mass detected.    Pelvic exam was performed with patient in the lithotomy position.  Breasts:    Right: No mass, skin change or tenderness.     Left: No mass, skin change or tenderness.  HENT:     Head: Normocephalic and atraumatic. No laceration.     Right Ear: Hearing normal.     Left Ear: Hearing normal.     Mouth/Throat:     Pharynx: Uvula midline.  Eyes:     Pupils: Pupils are equal, round, and reactive to light.  Neck:     Thyroid: No thyromegaly.  Cardiovascular:     Rate and Rhythm: Normal rate and regular rhythm.     Heart sounds: No murmur heard.   No friction rub. No gallop.  Pulmonary:     Effort: Pulmonary effort is normal. No respiratory distress.     Breath sounds: Normal breath sounds. No wheezing.  Abdominal:     General: Bowel sounds are normal. There is no distension.     Palpations: Abdomen is soft.     Tenderness: There is no abdominal tenderness. There is no rebound.  Musculoskeletal:        General: Normal range of motion.     Cervical back: Normal range of motion and neck supple.  Neurological:     Mental Status: She is alert and oriented to person, place, and time.     Cranial Nerves: No cranial nerve deficit.  Skin:    General:  Skin is warm and dry.  Psychiatric:        Judgment: Judgment normal.  Vitals reviewed.    Assessment:  ANNUAL EXAM 1. Women's annual routine gynecological examination   2. Screening for cervical cancer   3. Insertion of Implanon      Screening Plan:  1.  Cervical Screening-  Pap smear done today  2. Breast screening- Exam annually and mammogram>40 planned   3. Colonoscopy every 10 years, Hemoccult testing - after age 7  4. Labs managed by PCP  5. Counseling for contraception:  Desires to continue to avoid pregnancy.  [facility] - 01/31/21 1403       Pregnancy Intention Screening   Does the patient want to become pregnant in the next year? No    Does the patient's partner want to become pregnant in the next year? No    Would the patient like to discuss contraceptive options today? No      Contraception Wrap Up   Current Method Hormonal Implant    End Method Hormonal Implant    Contraception Counseling Provided No            The pregnancy intention screening data noted above was reviewed. Potential methods of contraception were discussed. The patient elected to proceed with Hormonal Implant.   6. Nexplanon removal and reinsertion  Patient given informed consent, signed copy in the chart, time out was performed.   Procedure note - The Nexplanon was noted in the patient's arm and the end was identified. The skin was cleansed with a Betadine solution. A small injection (3 cc) of subcutaneous lidocaine with epinephrine was given over the end of the implant. An incision was made at the end of the implant. The rod was noted in the incision and grasped with a hemostat. It was noted to be intact.  Hemostasis was noted.  Nexplanon Insertion Nexplanon removed form packaging,  Device confirmed in needle, then inserted full length of needle and withdrawn per handbook instructions.  Pt insertion site covered with steri-strip and a bandage.   Minimal blood loss.  Pt  tolerated the procedure well.     F/U  Return in about 1 year (around 01/31/2022) for Annual.  Barnett Applebaum, MD, [facility], [facility] 01/31/2021  2:50 PM

## 2021-02-05 LAB — CYTOLOGY - PAP
Comment: NEGATIVE
Diagnosis: NEGATIVE
High risk HPV: NEGATIVE

## 2021-02-22 ENCOUNTER — Ambulatory Visit: Payer: 59 | Admitting: Certified Registered"

## 2021-02-22 ENCOUNTER — Encounter
Admission: RE | Disposition: A | Discharge status: Home / Self Care | Payer: Self-pay | Source: Home / Self Care | Attending: Gastroenterology

## 2021-02-22 ENCOUNTER — Other Ambulatory Visit: Payer: Self-pay

## 2021-02-22 ENCOUNTER — Encounter: Payer: Self-pay | Admitting: Gastroenterology

## 2021-02-22 ENCOUNTER — Ambulatory Visit
Admission: RE | Admit: 2021-02-22 | Discharge: 2021-02-22 | Disposition: A | Discharge status: Home / Self Care | Payer: 59 | Attending: Gastroenterology | Admitting: Gastroenterology

## 2021-02-22 DIAGNOSIS — E669 Obesity, unspecified: Secondary | ICD-10-CM | POA: Diagnosis not present

## 2021-02-22 DIAGNOSIS — Z6832 Body mass index (BMI) 32.0-32.9, adult: Secondary | ICD-10-CM | POA: Diagnosis not present

## 2021-02-22 DIAGNOSIS — Z1211 Encounter for screening for malignant neoplasm of colon: Secondary | ICD-10-CM | POA: Insufficient documentation

## 2021-02-22 DIAGNOSIS — K219 Gastro-esophageal reflux disease without esophagitis: Secondary | ICD-10-CM | POA: Diagnosis not present

## 2021-02-22 HISTORY — PX: COLONOSCOPY: SHX5424

## 2021-02-22 LAB — POCT PREGNANCY, URINE: Preg Test, Ur: NEGATIVE

## 2021-02-22 SURGERY — COLONOSCOPY
Anesthesia: General

## 2021-02-22 MED ORDER — SODIUM CHLORIDE 0.9 % IV SOLN: INTRAVENOUS | Status: DC

## 2021-02-22 MED ORDER — PROPOFOL 10 MG/ML IV BOLUS
INTRAVENOUS | Status: DC | PRN
  Administered 2021-02-22: 100 mg via INTRAVENOUS

## 2021-02-22 MED ORDER — LIDOCAINE HCL (CARDIAC) PF 100 MG/5ML IV SOSY
PREFILLED_SYRINGE | INTRAVENOUS | Status: DC | PRN
  Administered 2021-02-22: 100 mg via INTRAVENOUS

## 2021-02-22 MED ORDER — PROPOFOL 500 MG/50ML IV EMUL
INTRAVENOUS | Status: DC | PRN
  Administered 2021-02-22: 135 ug/kg/min via INTRAVENOUS

## 2021-02-22 MED ORDER — PROPOFOL 500 MG/50ML IV EMUL
INTRAVENOUS | Status: AC
  Filled 2021-02-22: qty 50

## 2021-02-22 NOTE — H&P (Signed)
Jonathon Bellows, MD [address] [place], [place], [place], [address] [id number], [place], [place], [place], [address] Phone: [phone]  Fax: [phone]  Primary Care Physician:  Leone Haven, MD   Pre-Procedure History & Physical: HPI:  Valerie Lindsey is a 46 y.o. female is here for an colonoscopy.   Past Medical History:  Diagnosis Date   Amenorrhea    Cholelithiasis    GERD (gastroesophageal reflux disease)    Migraine    Ovarian cyst     Past Surgical History:  Procedure Laterality Date   CHOLECYSTECTOMY  2014   ORIF ANKLE FRACTURE Right 04/06/2020   Procedure: OPEN REDUCTION INTERNAL FIXATION (ORIF) ANKLE FRACTURE;  Surgeon: Corky Mull, MD;  Location: [facility] ORS;  Service: Orthopedics;  Laterality: Right;    Prior to Admission medications   Medication Sig Start Date End Date Taking? Authorizing Provider  omeprazole (PRILOSEC) 40 MG capsule TAKE 1 CAPSULE (40 MG TOTAL) BY MOUTH DAILY. 01/23/21 01/23/22 Yes Leone Haven, MD  venlafaxine XR (EFFEXOR-XR) 37.5 MG 24 hr capsule TAKE 2 CAPSULES (75 MG) BY MOUTH DAILY WITH BREAKFAST. 11/27/20 11/27/21 Yes Leone Haven, MD  SUMAtriptan (IMITREX) 100 MG tablet TAKE 1 TABLET AT START OF MIGRAINE. MAY HAVE SECOND DOSE IN 2 HOURS. Patient not taking: Reported on 01/31/2021 10/26/20 10/26/21  Leone Haven, MD    Allergies as of 01/22/2021   (No Known Allergies)    Family History  Problem Relation Age of Onset   Prostate cancer Father    Heart disease Father    Hyperlipidemia Mother    Heart disease Mother    Hypertension Mother    Stroke Paternal Grandmother    Breast cancer Neg Hx     Social History   Socioeconomic History   Marital status: Married    Spouse name: Not on file   Number of children: Not on file   Years of education: Not on file   Highest education level: Not on file  Occupational History   Not on file  Tobacco Use   Smoking status: Never   Smokeless tobacco: Never   Vaping Use   Vaping Use: Never used  Substance and Sexual Activity   Alcohol use: No    Alcohol/week: 0.0 standard drinks   Drug use: No   Sexual activity: Yes    Birth control/protection: None  Other Topics Concern   Not on file  Social History Narrative   Not on file   Social Determinants of Health   Financial Resource Strain: Not on file  Food Insecurity: Not on file  Transportation Needs: Not on file  Physical Activity: Not on file  Stress: Not on file  Social Connections: Not on file  Intimate Partner Violence: Not on file    Review of Systems: See HPI, otherwise negative ROS  Physical Exam: BP (!) 167/107    Pulse 99    Temp (!) 96 F (35.6 C) (Temporal)    Resp 20    Ht 5\' 6"  (1.676 m)    Wt 92.5 kg    SpO2 100%    BMI 32.93 kg/m  General:   Alert,  pleasant and cooperative in NAD Head:  Normocephalic and atraumatic. Neck:  Supple; no masses or thyromegaly. Lungs:  Clear throughout to auscultation, normal respiratory effort.    Heart:  +S1, +S2, Regular rate and rhythm, No edema. Abdomen:  Soft, nontender and nondistended. Normal bowel sounds, without guarding, and without rebound.  Neurologic:  Alert and  oriented x4;  grossly normal neurologically.  Impression/Plan: SARAGRACE SELKE is here for an colonoscopy to be performed for Screening colonoscopy average risk   Risks, benefits, limitations, and alternatives regarding  colonoscopy have been reviewed with the patient.  Questions have been answered.  All parties agreeable.   Jonathon Bellows, MD  02/22/2021, 10:09 AM

## 2021-02-22 NOTE — Anesthesia Postprocedure Evaluation (Signed)
Anesthesia Post Note  Patient: Valerie Lindsey  Procedure(s) Performed: COLONOSCOPY  Patient location during evaluation: Endoscopy Anesthesia Type: General Level of consciousness: awake and alert Pain management: pain level controlled Vital Signs Assessment: post-procedure vital signs reviewed and stable Respiratory status: spontaneous breathing, nonlabored ventilation and respiratory function stable Cardiovascular status: blood pressure returned to baseline and stable Postop Assessment: no apparent nausea or vomiting Anesthetic complications: no   No notable events documented.   Last Vitals:  Vitals:   02/22/21 1037 02/22/21 1047  BP: (!) 130/93 135/90  Pulse: 77 69  Resp: (!) 25 16  Temp:    SpO2: 98% 100%    Last Pain:  Vitals:   02/22/21 1047  TempSrc:   PainSc: 0-No pain                 Iran Ouch

## 2021-02-22 NOTE — Op Note (Signed)
[facility] Gastroenterology Patient Name: Valerie Lindsey Procedure Date: 02/22/2021 10:01 AM MRN: [id number] Account #: [id number] Date of Birth: 1975/12/18 Admit Type: Outpatient Age: 46 Room: [facility] ENDO ROOM 4 Gender: Female Note Status: Finalized Instrument Name: Jasper Riling [id number] Procedure:             Colonoscopy Indications:           Screening for colorectal malignant neoplasm Providers:             Jonathon Bellows MD, MD Medicines:             Monitored Anesthesia Care Complications:         No immediate complications. Procedure:             Pre-Anesthesia Assessment:                        - Prior to the procedure, a History and Physical was                         performed, and patient medications, allergies and                         sensitivities were reviewed. The patient's tolerance                         of previous anesthesia was reviewed.                        - The risks and benefits of the procedure and the                         sedation options and risks were discussed with the                         patient. All questions were answered and informed                         consent was obtained.                        - ASA Grade Assessment: II - A patient with mild                         systemic disease.                        After obtaining informed consent, the colonoscope was                         passed under direct vision. Throughout the procedure,                         the patient's blood pressure, pulse, and oxygen                         saturations were monitored continuously. The                         Colonoscope was introduced through the anus and  advanced to the the cecum, identified by the                         appendiceal orifice. The colonoscopy was performed                         with ease. The patient tolerated the procedure well.                         The quality of the bowel  preparation was good. Findings:      The perianal and digital rectal examinations were normal.      The entire examined colon appeared normal on direct and retroflexion       views. Impression:            - The entire examined colon is normal on direct and                         retroflexion views.                        - No specimens collected. Recommendation:        - Discharge patient to home (with escort).                        - Resume previous diet.                        - Continue present medications.                        - Repeat colonoscopy in 10 years for screening                         purposes. Procedure Code(s):     --- Professional ---                        [phone], Colonoscopy, flexible; diagnostic, including                         collection of specimen(s) by brushing or washing, when                         performed (separate procedure) Diagnosis Code(s):     --- Professional ---                        Z12.11, Encounter for screening for malignant neoplasm                         of colon CPT copyright 2019 American Medical Association. All rights reserved. The codes documented in this report are preliminary and upon coder review may  be revised to meet current compliance requirements. Jonathon Bellows, MD Jonathon Bellows MD, MD 02/22/2021 10:26:25 AM This report has been signed electronically. Number of Addenda: 0 Note Initiated On: 02/22/2021 10:01 AM Scope Withdrawal Time: 0 hours 8 minutes 43 seconds  Total Procedure Duration: 0 hours 11 minutes 27 seconds  Estimated Blood Loss:  Estimated blood loss: none.      [facility]

## 2021-02-22 NOTE — Transfer of Care (Addendum)
Immediate Anesthesia Transfer of Care Note  Patient: Valerie Lindsey  Procedure(s) Performed: COLONOSCOPY  Patient Location: PACU  Anesthesia Type:General  Level of Consciousness: awake  Airway & Oxygen Therapy: Patient Spontanous Breathing  Post-op Assessment: Report given to RN  Post vital signs: stable  Last Vitals:  Vitals Value Taken Time  BP    Temp    Pulse    Resp    SpO2      Last Pain:  Vitals:   02/22/21 0925  TempSrc: Temporal  PainSc: 0-No pain         Complications: No notable events documented.

## 2021-02-22 NOTE — Anesthesia Preprocedure Evaluation (Signed)
Anesthesia Evaluation  Patient identified by MRN, date of birth, ID band Patient awake  General Assessment Comment:Ate chick fil a at 0830.  Reviewed: Allergy & Precautions, NPO status , Patient's Chart, lab work & pertinent test results  History of Anesthesia Complications Negative for: history of anesthetic complications  Airway Mallampati: II  TM Distance: >3 FB Neck ROM: Full    Dental no notable dental hx. (+) Teeth Intact   Pulmonary neg pulmonary ROS, neg sleep apnea, neg COPD, Patient abstained from smoking.Not current smoker,    Pulmonary exam normal breath sounds clear to auscultation       Cardiovascular Exercise Tolerance: Good METS(-) hypertension(-) CAD and (-) Past MI negative cardio ROS  (-) dysrhythmias  Rhythm:Regular Rate:Normal - Systolic murmurs    Neuro/Psych  Headaches, negative psych ROS   GI/Hepatic GERD  Medicated and Controlled,(+)     (-) substance abuse  ,   Endo/Other  neg diabetes  Renal/GU negative Renal ROS     Musculoskeletal   Abdominal (+) + obese,   Peds  Hematology   Anesthesia Other Findings Past Medical History: No date: Amenorrhea No date: Cholelithiasis No date: GERD (gastroesophageal reflux disease) No date: Migraine No date: Ovarian cyst  Reproductive/Obstetrics                             Anesthesia Physical  Anesthesia Plan  ASA: II  Anesthesia Plan: General   Post-op Pain Management:  Regional for Post-op pain   Induction: Intravenous  PONV Risk Score and Plan: 3 and TIVA and Treatment may vary due to age or medical condition  Airway Management Planned: Nasal Cannula and Natural Airway  Additional Equipment: None  Intra-op Plan:   Post-operative Plan: Extubation in OR  Informed Consent: I have reviewed the patients History and Physical, chart, labs and discussed the procedure including the risks, benefits and alternatives  for the proposed anesthesia with the patient or authorized representative who has indicated his/her understanding and acceptance.     Dental advisory given  Plan Discussed with: CRNA and Surgeon  Anesthesia Plan Comments:         Anesthesia Quick Evaluation

## 2021-02-24 NOTE — Progress Notes (Signed)
Voicemail.  No Message Left. 

## 2021-02-27 ENCOUNTER — Other Ambulatory Visit: Payer: Self-pay

## 2021-02-27 MED FILL — Venlafaxine HCl Cap ER 24HR 37.5 MG (Base Equivalent): ORAL | 30 days supply | Qty: 60 | Fill #3 | Status: AC

## 2021-03-07 DIAGNOSIS — D3161 Benign neoplasm of unspecified site of right orbit: Secondary | ICD-10-CM | POA: Diagnosis not present

## 2021-03-26 ENCOUNTER — Other Ambulatory Visit: Payer: Self-pay | Admitting: Family Medicine

## 2021-03-27 ENCOUNTER — Other Ambulatory Visit: Payer: Self-pay

## 2021-03-27 MED ORDER — VENLAFAXINE HCL ER 37.5 MG PO CP24
ORAL_CAPSULE | ORAL | 3 refills | Status: DC
  Filled 2021-03-27: qty 60, 30d supply, fill #0
  Filled 2021-04-26: qty 60, 30d supply, fill #1
  Filled 2021-05-27: qty 60, 30d supply, fill #2
  Filled 2021-06-26: qty 60, 30d supply, fill #3

## 2021-04-26 ENCOUNTER — Other Ambulatory Visit: Payer: Self-pay

## 2021-05-13 DIAGNOSIS — D3161 Benign neoplasm of unspecified site of right orbit: Secondary | ICD-10-CM | POA: Insufficient documentation

## 2021-05-27 ENCOUNTER — Other Ambulatory Visit: Payer: Self-pay

## 2021-05-31 DIAGNOSIS — E669 Obesity, unspecified: Secondary | ICD-10-CM | POA: Diagnosis not present

## 2021-05-31 DIAGNOSIS — Z6832 Body mass index (BMI) 32.0-32.9, adult: Secondary | ICD-10-CM | POA: Diagnosis not present

## 2021-05-31 DIAGNOSIS — Z9889 Other specified postprocedural states: Secondary | ICD-10-CM | POA: Insufficient documentation

## 2021-05-31 DIAGNOSIS — H0589 Other disorders of orbit: Secondary | ICD-10-CM | POA: Diagnosis not present

## 2021-05-31 DIAGNOSIS — D3161 Benign neoplasm of unspecified site of right orbit: Secondary | ICD-10-CM | POA: Diagnosis not present

## 2021-06-14 ENCOUNTER — Other Ambulatory Visit: Payer: Self-pay

## 2021-06-14 MED ORDER — NEOMYCIN-POLYMYXIN-DEXAMETH 3.5-10000-0.1 OP SUSP
OPHTHALMIC | 0 refills | Status: DC
  Filled 2021-06-14: qty 5, 21d supply, fill #0

## 2021-06-26 ENCOUNTER — Other Ambulatory Visit: Payer: Self-pay

## 2021-07-21 ENCOUNTER — Other Ambulatory Visit: Payer: Self-pay

## 2021-07-21 ENCOUNTER — Other Ambulatory Visit: Payer: Self-pay | Admitting: Family Medicine

## 2021-07-21 MED ORDER — OMEPRAZOLE 40 MG PO CPDR
DELAYED_RELEASE_CAPSULE | Freq: Every day | ORAL | 1 refills | Status: DC
  Filled 2021-07-21: qty 90, 90d supply, fill #0
  Filled 2021-10-28: qty 90, 90d supply, fill #1

## 2021-07-21 MED ORDER — VENLAFAXINE HCL ER 37.5 MG PO CP24
ORAL_CAPSULE | ORAL | 3 refills | Status: DC
  Filled 2021-07-21: qty 60, 30d supply, fill #0
  Filled 2021-08-30: qty 60, 30d supply, fill #1
  Filled 2021-09-29: qty 60, 30d supply, fill #2
  Filled 2021-10-28: qty 60, 30d supply, fill #3

## 2021-07-22 ENCOUNTER — Other Ambulatory Visit: Payer: Self-pay

## 2021-08-30 ENCOUNTER — Other Ambulatory Visit: Payer: Self-pay

## 2021-09-30 ENCOUNTER — Other Ambulatory Visit: Payer: Self-pay

## 2021-10-01 ENCOUNTER — Other Ambulatory Visit: Payer: Self-pay

## 2021-10-28 ENCOUNTER — Other Ambulatory Visit: Payer: Self-pay | Admitting: Family Medicine

## 2021-10-28 ENCOUNTER — Other Ambulatory Visit: Payer: Self-pay

## 2021-10-28 MED ORDER — SUMATRIPTAN SUCCINATE 100 MG PO TABS
ORAL_TABLET | ORAL | 5 refills | Status: DC
  Filled 2021-10-28: qty 9, 30d supply, fill #0
  Filled 2021-11-22: qty 9, 30d supply, fill #1
  Filled 2022-02-04: qty 9, 30d supply, fill #2
  Filled 2022-09-09: qty 9, 30d supply, fill #3

## 2021-10-29 ENCOUNTER — Other Ambulatory Visit: Payer: Self-pay

## 2021-11-22 ENCOUNTER — Other Ambulatory Visit: Payer: Self-pay | Admitting: Family

## 2021-11-22 ENCOUNTER — Other Ambulatory Visit: Payer: Self-pay

## 2021-11-25 ENCOUNTER — Other Ambulatory Visit: Payer: Self-pay

## 2021-11-26 ENCOUNTER — Other Ambulatory Visit: Payer: Self-pay

## 2021-11-26 ENCOUNTER — Other Ambulatory Visit: Payer: Self-pay | Admitting: Family Medicine

## 2021-11-26 MED FILL — Venlafaxine HCl Cap ER 24HR 37.5 MG (Base Equivalent): ORAL | 30 days supply | Qty: 60 | Fill #0 | Status: AC

## 2021-11-26 MED FILL — Omeprazole Cap Delayed Release 40 MG: ORAL | 90 days supply | Qty: 90 | Fill #0 | Status: CN

## 2021-11-29 ENCOUNTER — Encounter: Payer: Self-pay | Admitting: Family Medicine

## 2021-11-29 ENCOUNTER — Ambulatory Visit (INDEPENDENT_AMBULATORY_CARE_PROVIDER_SITE_OTHER): Payer: 59 | Admitting: Family Medicine

## 2021-11-29 VITALS — BP 120/70 | HR 93 | Temp 98.6°F | Ht 65.5 in | Wt 198.0 lb

## 2021-11-29 DIAGNOSIS — E669 Obesity, unspecified: Secondary | ICD-10-CM

## 2021-11-29 DIAGNOSIS — Z1231 Encounter for screening mammogram for malignant neoplasm of breast: Secondary | ICD-10-CM | POA: Diagnosis not present

## 2021-11-29 DIAGNOSIS — Z0000 Encounter for general adult medical examination without abnormal findings: Secondary | ICD-10-CM | POA: Diagnosis not present

## 2021-11-29 DIAGNOSIS — Z13220 Encounter for screening for lipoid disorders: Secondary | ICD-10-CM | POA: Diagnosis not present

## 2021-11-29 NOTE — Progress Notes (Signed)
Tommi Rumps, MD Phone: [phone]  Valerie Lindsey is a 46 y.o. female who presents today for CPE.  Diet: keto, low carb and a meat, not too much fat Exercise: walks some and has been doing stairs at work for 5 minutes at a time Pap smear: 01/31/21, NILM neg HPV Colonoscopy: 02/22/21, 10 year recall Mammogram: due Family history-  Colon cancer: no  Breast cancer: no  Ovarian cancer: no Vaccines-   Flu: UTD  Tetanus: UTD  COVID19: x3 Tobacco use: no Alcohol use: no Illicit Drug use: no Dentist: yes Ophthalmology: not for a vision check   Active Ambulatory Problems    Diagnosis Date Noted   Routine general medical examination at a health care facility 06/11/2015   Migraine 06/11/2015   Obesity (BMI 30.0-34.9) 06/14/2017   History of anemia 06/14/2017   GERD (gastroesophageal reflux disease) 05/11/2019   Irregular menses 05/11/2019   Status post open reduction with internal fixation (ORIF) of fracture of ankle 04/06/2020   Ankle fracture, right 04/06/2020   Herniation of orbital fat 11/28/2020   Benign neoplasm of orbit, 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   Fracture of ankle, trimalleolar, right, closed 04/06/2020   PONV (postoperative nausea and vomiting) 05/31/2021   Resolved Ambulatory Problems    Diagnosis Date Noted   Overweight (BMI 25.0-29.9) 11/21/2016   Past Medical History:  Diagnosis Date   Amenorrhea    Cholelithiasis    Ovarian cyst     Family History  Problem Relation Age of Onset   Prostate cancer Father    Heart disease Father    Hyperlipidemia Mother    Heart disease Mother    Hypertension Mother    Stroke Paternal Grandmother    Breast cancer Neg Hx     Social History   Socioeconomic History   Marital status: Married    Spouse name: Not on file   Number of children: Not on file   Years of education: Not on file   Highest education level: Not on file  Occupational History   Not on file  Tobacco Use   Smoking status: Never   Smokeless tobacco:  Never  Vaping Use   Vaping Use: Never used  Substance and Sexual Activity   Alcohol use: No    Alcohol/week: 0.0 standard drinks of alcohol   Drug use: No   Sexual activity: Yes    Birth control/protection: None  Other Topics Concern   Not on file  Social History Narrative   Not on file   Social Determinants of Health   Financial Resource Strain: Not on file  Food Insecurity: Not on file  Transportation Needs: Not on file  Physical Activity: Not on file  Stress: Not on file  Social Connections: Not on file  Intimate Partner Violence: Not on file    ROS  General:  Negative for nexplained weight loss, fever Skin: Negative for new or changing mole, sore that won't heal HEENT: Negative for trouble hearing, trouble seeing, ringing in ears, mouth sores, hoarseness, change in voice, dysphagia. CV:  Negative for chest pain, dyspnea, edema, palpitations Resp: Negative for cough, dyspnea, hemoptysis GI: Negative for nausea, vomiting, diarrhea, constipation, abdominal pain, melena, hematochezia. GU: Negative for dysuria, incontinence, urinary hesitance, hematuria, vaginal or penile discharge, polyuria, sexual difficulty, lumps in testicle or breasts MSK: Negative for muscle cramps or aches, joint pain or swelling Neuro: Negative for headaches, weakness, numbness, dizziness, passing out/fainting Psych: Negative for depression, anxiety, memory problems  Objective  Physical Exam Vitals:   11/29/21  1614  BP: 120/70  Pulse: 93  Temp: 98.6 F (37 C)  SpO2: 98%    BP Readings from Last 3 Encounters:  11/29/21 120/70  02/22/21 135/90  01/31/21 120/80   Wt Readings from Last 3 Encounters:  11/29/21 198 lb (89.8 kg)  02/22/21 204 lb (92.5 kg)  01/31/21 212 lb (96.2 kg)    Physical Exam Constitutional:      General: She is not in acute distress.    Appearance: She is not diaphoretic.  HENT:     Head: Normocephalic and atraumatic.  Cardiovascular:     Rate and Rhythm:  Normal rate and regular rhythm.     Heart sounds: Normal heart sounds.  Pulmonary:     Effort: Pulmonary effort is normal.     Breath sounds: Normal breath sounds.  Abdominal:     General: Bowel sounds are normal. There is no distension.     Palpations: Abdomen is soft.     Tenderness: There is no abdominal tenderness.  Skin:    General: Skin is warm and dry.  Neurological:     Mental Status: She is alert.  Psychiatric:        Mood and Affect: Mood normal.      Assessment/Plan:   Problem List Items Addressed This Visit     Obesity (BMI 30.0-34.9)   Relevant Orders   Lipid panel   HgB A1c   Routine general medical examination at a health care facility - Primary    Physical exam completed.  I encouraged healthy diet and exercise.  Discussed that keto could cause her cholesterol to go up and that typically when patients come off of keto they seem to gain most of their weight back.  Discussed increasing exercise.  Mammogram ordered.  Discussed updated COVID-vaccine and she will get this if she desires.  I encouraged her to see an eye doctor yearly now that she is in her 28s.  Lab work as outlined.      Other Visit Diagnoses     Lipid screening       Relevant Orders   Lipid panel   COMPLETE METABOLIC PANEL WITH GFR   Encounter for screening mammogram for malignant neoplasm of breast       Relevant Orders   MM 3D SCREEN BREAST BILATERAL       Return in about 1 year (around 11/30/2022) for CPE.   Tommi Rumps, MD [place]

## 2021-11-29 NOTE — Assessment & Plan Note (Signed)
Physical exam completed.  I encouraged healthy diet and exercise.  Discussed that keto could cause her cholesterol to go up and that typically when patients come off of keto they seem to gain most of their weight back.  Discussed increasing exercise.  Mammogram ordered.  Discussed updated COVID-vaccine and she will get this if she desires.  I encouraged her to see an eye doctor yearly now that she is in her 71s.  Lab work as outlined.

## 2021-11-29 NOTE — Patient Instructions (Signed)
Nice to see you. Please try to increase your exercise. We will get lab work today and contact you with the results.

## 2021-11-30 LAB — COMPLETE METABOLIC PANEL WITH GFR
AG Ratio: 1.8 (calc) (ref 1.0–2.5)
ALT: 16 U/L (ref 6–29)
AST: 16 U/L (ref 10–35)
Albumin: 4.5 g/dL (ref 3.6–5.1)
Alkaline phosphatase (APISO): 83 U/L (ref 31–125)
BUN: 16 mg/dL (ref 7–25)
CO2: 27 mmol/L (ref 20–32)
Calcium: 10 mg/dL (ref 8.6–10.2)
Chloride: 104 mmol/L (ref 98–110)
Creat: 0.79 mg/dL (ref 0.50–0.99)
Globulin: 2.5 g/dL (calc) (ref 1.9–3.7)
Glucose, Bld: 96 mg/dL (ref 65–99)
Potassium: 4.1 mmol/L (ref 3.5–5.3)
Sodium: 139 mmol/L (ref 135–146)
Total Bilirubin: 0.3 mg/dL (ref 0.2–1.2)
Total Protein: 7 g/dL (ref 6.1–8.1)
eGFR: 93 mL/min/{1.73_m2} (ref >=60)

## 2021-11-30 LAB — HEMOGLOBIN A1C
Hgb A1c MFr Bld: 5.6 % of total Hgb (ref <5.7)
Mean Plasma Glucose: 114 mg/dL
eAG (mmol/L): 6.3 mmol/L

## 2021-11-30 LAB — LIPID PANEL
Cholesterol: 207 mg/dL — ABNORMAL HIGH (ref <200)
HDL: 50 mg/dL (ref >=50)
LDL Cholesterol (Calc): 133 mg/dL (calc) — ABNORMAL HIGH
Non-HDL Cholesterol (Calc): 157 mg/dL (calc) — ABNORMAL HIGH (ref <130)
Total CHOL/HDL Ratio: 4.1 (calc) (ref <5.0)
Triglycerides: 125 mg/dL (ref <150)

## 2021-12-27 ENCOUNTER — Other Ambulatory Visit: Payer: Self-pay

## 2021-12-27 MED FILL — Venlafaxine HCl Cap ER 24HR 37.5 MG (Base Equivalent): ORAL | 30 days supply | Qty: 60 | Fill #1 | Status: AC

## 2022-01-24 ENCOUNTER — Other Ambulatory Visit: Payer: Self-pay

## 2022-01-24 MED FILL — Omeprazole Cap Delayed Release 40 MG: ORAL | 90 days supply | Qty: 90 | Fill #0 | Status: AC

## 2022-01-28 ENCOUNTER — Other Ambulatory Visit: Payer: Self-pay

## 2022-02-04 MED FILL — Venlafaxine HCl Cap ER 24HR 37.5 MG (Base Equivalent): ORAL | 30 days supply | Qty: 60 | Fill #2 | Status: AC

## 2022-03-04 MED FILL — Venlafaxine HCl Cap ER 24HR 37.5 MG (Base Equivalent): ORAL | 30 days supply | Qty: 60 | Fill #3 | Status: AC

## 2022-03-13 DIAGNOSIS — M79642 Pain in left hand: Secondary | ICD-10-CM | POA: Diagnosis not present

## 2022-03-13 DIAGNOSIS — S6392XA Sprain of unspecified part of left wrist and hand, initial encounter: Secondary | ICD-10-CM | POA: Diagnosis not present

## 2022-04-03 ENCOUNTER — Other Ambulatory Visit: Payer: Self-pay

## 2022-04-03 ENCOUNTER — Other Ambulatory Visit: Payer: Self-pay | Admitting: Family Medicine

## 2022-04-03 MED ORDER — VENLAFAXINE HCL ER 37.5 MG PO CP24
75.0000 mg | ORAL_CAPSULE | Freq: Every day | ORAL | 3 refills | Status: DC
  Filled 2022-04-03: qty 60, 30d supply, fill #0
  Filled 2022-05-12: qty 60, 30d supply, fill #1
  Filled 2022-06-10: qty 60, 30d supply, fill #2
  Filled 2022-07-10: qty 60, 30d supply, fill #3

## 2022-04-24 MED FILL — Omeprazole Cap Delayed Release 40 MG: ORAL | 90 days supply | Qty: 90 | Fill #1 | Status: AC

## 2022-04-25 ENCOUNTER — Other Ambulatory Visit: Payer: Self-pay

## 2022-05-12 ENCOUNTER — Other Ambulatory Visit: Payer: Self-pay

## 2022-05-14 ENCOUNTER — Ambulatory Visit
Admission: RE | Admit: 2022-05-14 | Discharge: 2022-05-14 | Disposition: A | Discharge status: Home / Self Care | Payer: Commercial Managed Care - PPO | Source: Ambulatory Visit | Attending: Family Medicine | Admitting: Family Medicine

## 2022-05-14 DIAGNOSIS — Z1231 Encounter for screening mammogram for malignant neoplasm of breast: Secondary | ICD-10-CM | POA: Insufficient documentation

## 2022-07-20 IMAGING — MG MM DIGITAL SCREENING BILAT W/ TOMO AND CAD
6 of 10 series · 6 of 30 positions shown · non-contrast
Comparison: Previous exam(s).

CLINICAL DATA: Screening.

EXAM:
DIGITAL SCREENING BILATERAL MAMMOGRAM WITH TOMOSYNTHESIS AND CAD
TECHNIQUE: Bilateral screening digital craniocaudal and mediolateral oblique
mammograms were obtained. Bilateral screening digital breast
tomosynthesis was performed. The images were evaluated with
computer-aided detection.

[R CC synth-2D]
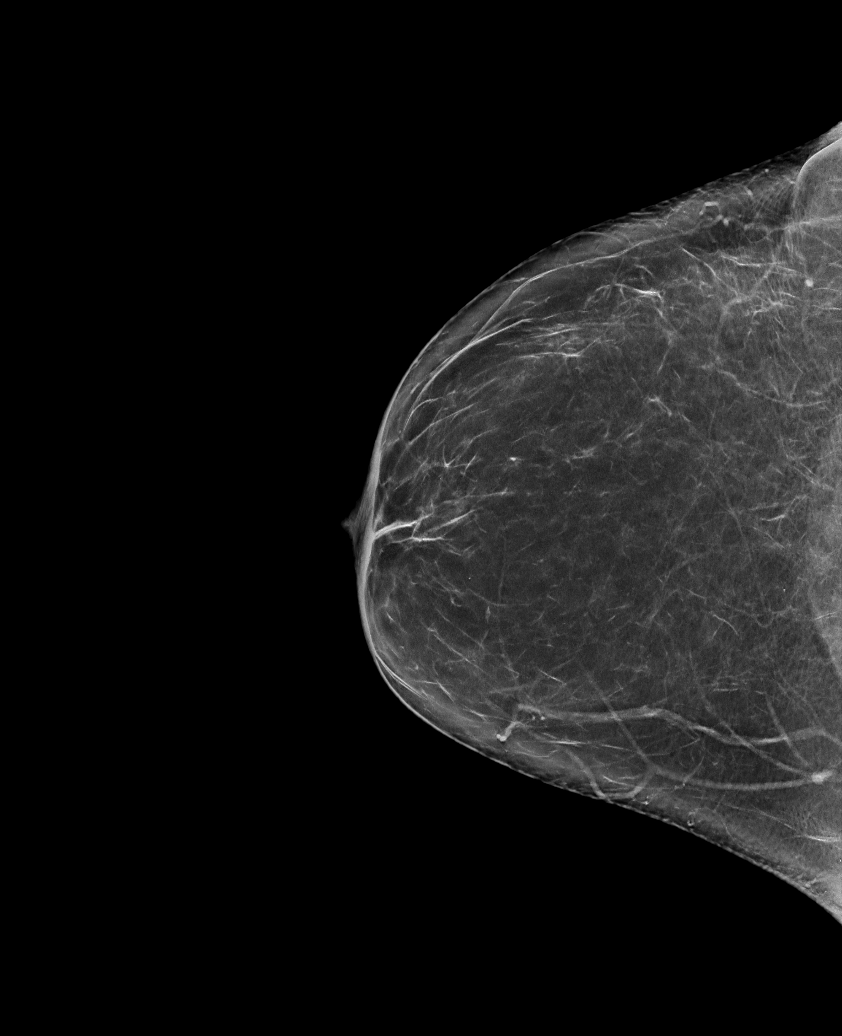

[L MLO synth-2D]
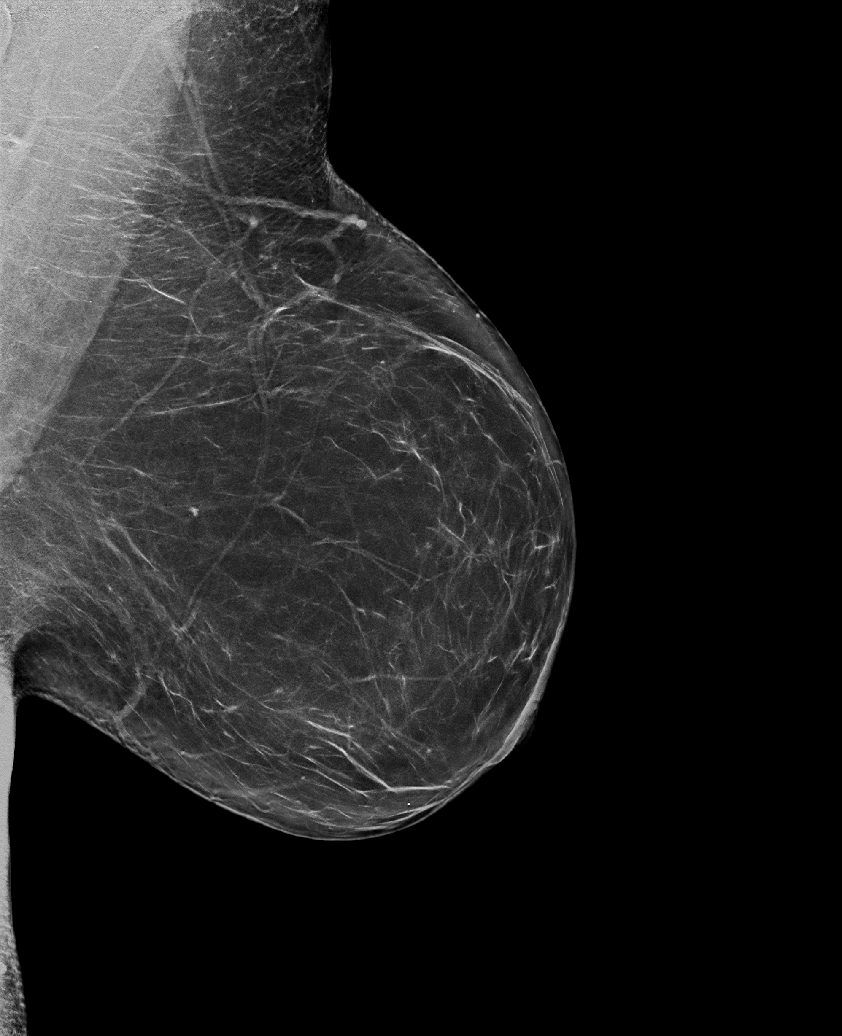

[R MLO synth-2D]
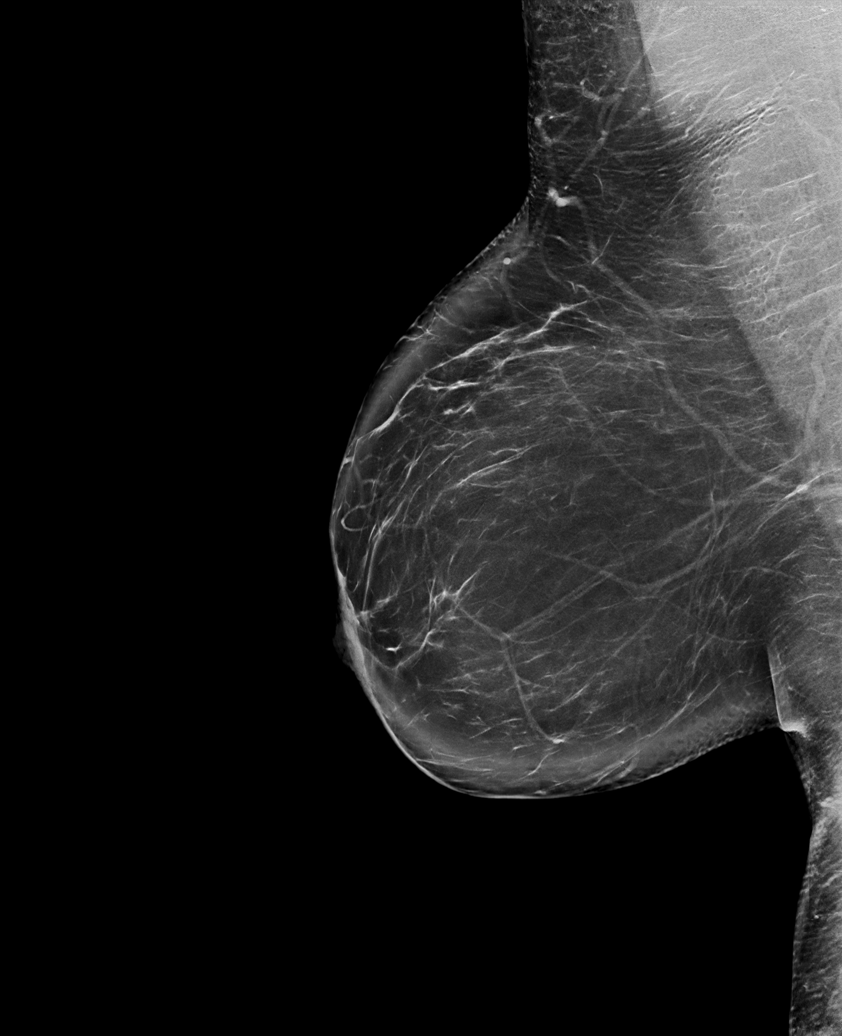

[L CC synth-2D]
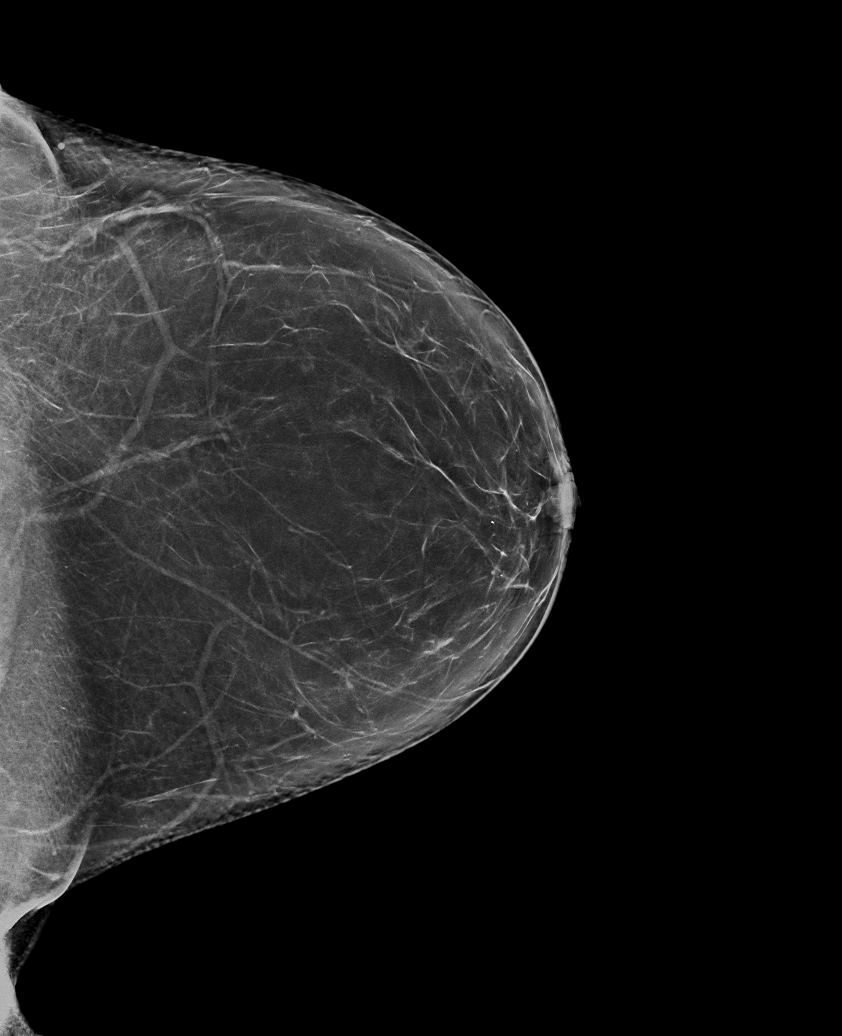

[L CV synth-2D]
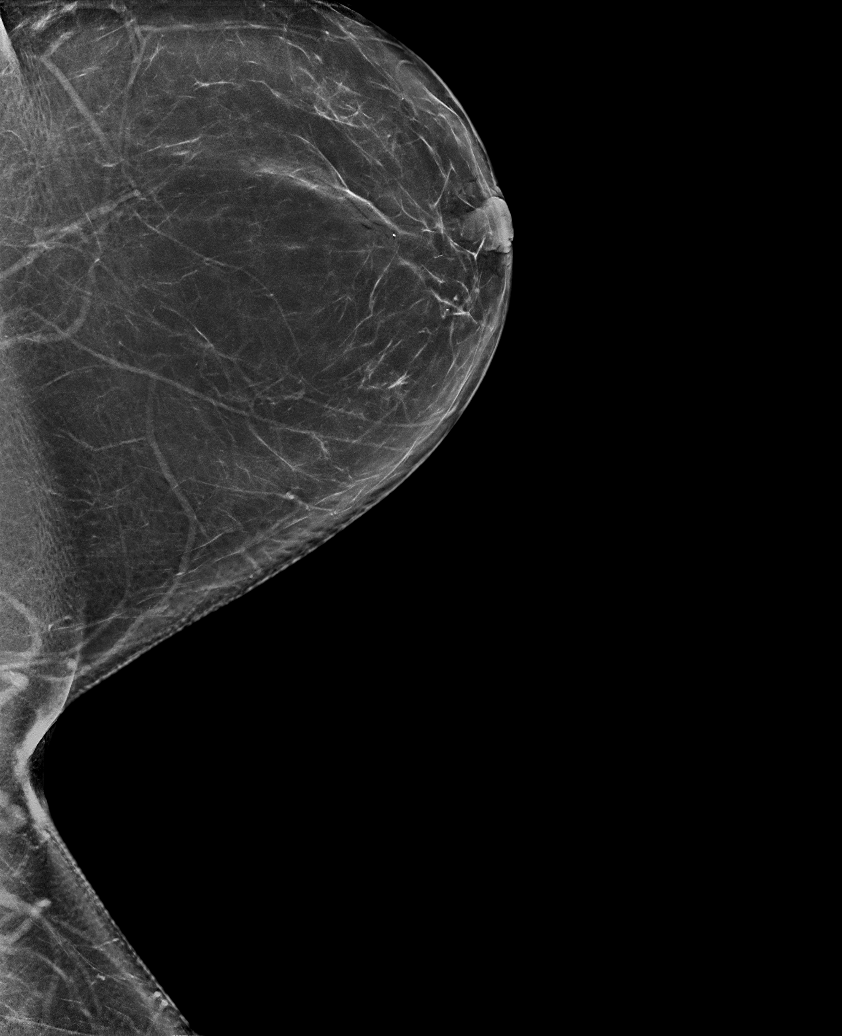

[L CC tomo · tomo slice 45/88.0]
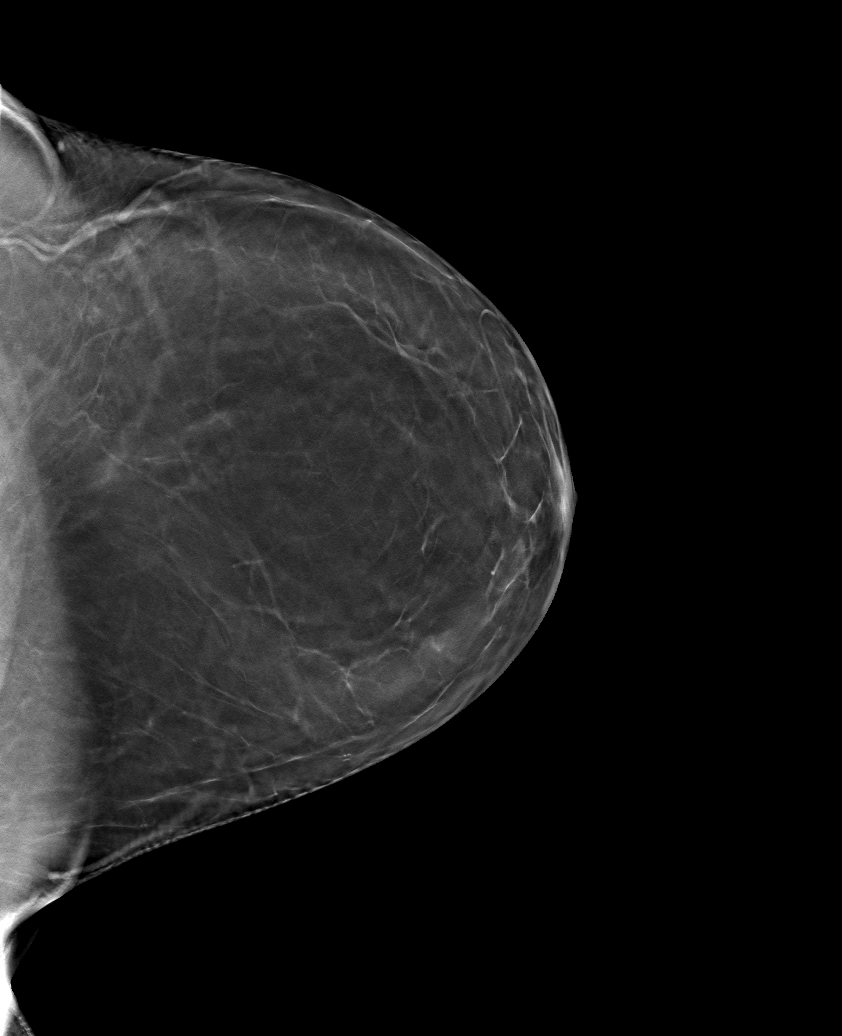

[6 of 30 positions shown; findings below may reference images not displayed]

ACR Breast Density Category b: There are scattered areas of
fibroglandular density.
FINDINGS: There are no findings suspicious for malignancy.
IMPRESSION: No mammographic evidence of malignancy. A result letter of this
screening mammogram will be mailed directly to the patient.

RECOMMENDATION:
Screening mammogram in one year. (Code:51-O-LD2)

BI-RADS CATEGORY  1: Negative.

## 2022-07-27 ENCOUNTER — Other Ambulatory Visit: Payer: Self-pay | Admitting: Family Medicine

## 2022-07-27 ENCOUNTER — Other Ambulatory Visit: Payer: Self-pay

## 2022-07-28 ENCOUNTER — Other Ambulatory Visit: Payer: Self-pay

## 2022-07-28 MED FILL — Omeprazole Cap Delayed Release 40 MG: ORAL | 90 days supply | Qty: 90 | Fill #0 | Status: AC

## 2022-07-29 ENCOUNTER — Other Ambulatory Visit: Payer: Self-pay

## 2022-08-07 ENCOUNTER — Other Ambulatory Visit: Payer: Self-pay

## 2022-08-07 ENCOUNTER — Other Ambulatory Visit: Payer: Self-pay | Admitting: Family Medicine

## 2022-08-08 ENCOUNTER — Other Ambulatory Visit: Payer: Self-pay

## 2022-08-08 MED FILL — Venlafaxine HCl Cap ER 24HR 37.5 MG (Base Equivalent): ORAL | 30 days supply | Qty: 60 | Fill #0 | Status: AC

## 2022-08-11 ENCOUNTER — Other Ambulatory Visit: Payer: Self-pay

## 2022-09-09 MED FILL — Venlafaxine HCl Cap ER 24HR 37.5 MG (Base Equivalent): ORAL | 30 days supply | Qty: 60 | Fill #1 | Status: AC

## 2022-10-10 MED FILL — Venlafaxine HCl Cap ER 24HR 37.5 MG (Base Equivalent): ORAL | 30 days supply | Qty: 60 | Fill #2 | Status: AC

## 2022-10-22 MED FILL — Omeprazole Cap Delayed Release 40 MG: ORAL | 90 days supply | Qty: 90 | Fill #1 | Status: AC

## 2022-11-10 MED FILL — Venlafaxine HCl Cap ER 24HR 37.5 MG (Base Equivalent): ORAL | 30 days supply | Qty: 60 | Fill #3 | Status: AC

## 2022-12-01 ENCOUNTER — Other Ambulatory Visit: Payer: Self-pay

## 2022-12-01 ENCOUNTER — Ambulatory Visit: Payer: Commercial Managed Care - PPO | Admitting: Family Medicine

## 2022-12-01 ENCOUNTER — Encounter: Payer: Self-pay | Admitting: Family Medicine

## 2022-12-01 VITALS — BP 126/80 | HR 94 | Temp 98.2°F | Ht 65.5 in | Wt 218.7 lb

## 2022-12-01 DIAGNOSIS — Z862 Personal history of diseases of the blood and blood-forming organs and certain disorders involving the immune mechanism: Secondary | ICD-10-CM

## 2022-12-01 DIAGNOSIS — Z0000 Encounter for general adult medical examination without abnormal findings: Secondary | ICD-10-CM | POA: Diagnosis not present

## 2022-12-01 DIAGNOSIS — E66811 Obesity, class 1: Secondary | ICD-10-CM

## 2022-12-01 DIAGNOSIS — Z13220 Encounter for screening for lipoid disorders: Secondary | ICD-10-CM

## 2022-12-01 LAB — COMPREHENSIVE METABOLIC PANEL
ALT: 15 U/L (ref 0–35)
AST: 17 U/L (ref 0–37)
Albumin: 4.2 g/dL (ref 3.5–5.2)
Alkaline Phosphatase: 84 U/L (ref 39–117)
BUN: 11 mg/dL (ref 6–23)
CO2: 29 meq/L (ref 19–32)
Calcium: 9.7 mg/dL (ref 8.4–10.5)
Chloride: 102 meq/L (ref 96–112)
Creatinine, Ser: 0.84 mg/dL (ref 0.40–1.20)
GFR: 82.84 mL/min (ref >60.00)
Glucose, Bld: 93 mg/dL (ref 70–99)
Potassium: 4.3 meq/L (ref 3.5–5.1)
Sodium: 138 meq/L (ref 135–145)
Total Bilirubin: 0.3 mg/dL (ref 0.2–1.2)
Total Protein: 7.3 g/dL (ref 6.0–8.3)

## 2022-12-01 LAB — CBC
HCT: 40.9 % (ref 36.0–46.0)
Hemoglobin: 13.2 g/dL (ref 12.0–15.0)
MCHC: 32.2 g/dL (ref 30.0–36.0)
MCV: 80.1 fL (ref 78.0–100.0)
Platelets: 295 10*3/uL (ref 150.0–400.0)
RBC: 5.1 Mil/uL (ref 3.87–5.11)
RDW: 15.4 % (ref 11.5–15.5)
WBC: 6.4 10*3/uL (ref 4.0–10.5)

## 2022-12-01 LAB — IBC + FERRITIN
Ferritin: 31.2 ng/mL (ref 10.0–291.0)
Iron: 61 ug/dL (ref 42–145)
Saturation Ratios: 17.9 % — ABNORMAL LOW (ref 20.0–50.0)
TIBC: 340.2 ug/dL (ref 250.0–450.0)
Transferrin: 243 mg/dL (ref 212.0–360.0)

## 2022-12-01 LAB — LIPID PANEL
Cholesterol: 210 mg/dL — ABNORMAL HIGH (ref 0–200)
HDL: 46.9 mg/dL (ref >39.00)
LDL Cholesterol: 138 mg/dL — ABNORMAL HIGH (ref 0–99)
NonHDL: 162.7
Total CHOL/HDL Ratio: 4
Triglycerides: 126 mg/dL (ref 0.0–149.0)
VLDL: 25.2 mg/dL (ref 0.0–40.0)

## 2022-12-01 LAB — HEMOGLOBIN A1C: Hgb A1c MFr Bld: 5.9 % (ref 4.6–6.5)

## 2022-12-01 MED ORDER — VENLAFAXINE HCL ER 37.5 MG PO CP24
75.0000 mg | ORAL_CAPSULE | Freq: Every day | ORAL | 1 refills | Status: DC
  Filled 2022-12-01 – 2022-12-13 (×2): qty 180, 90d supply, fill #0
  Filled 2023-03-17: qty 180, 90d supply, fill #1

## 2022-12-01 NOTE — Progress Notes (Signed)
Marikay Alar, MD Phone: [phone]  Valerie Lindsey is a 47 y.o. female who presents today for CPE.  Diet: Similar to last year, some fruits and vegetables, fried foods 2-3 times a week, sweets most days though not a large volume, 3 diet sodas per day Exercise: Walking 2 times a week for 30 minutes Pap smear: 01/31/2021 negative HPV NILM Colonoscopy: 02/23/2308 year recall Mammogram: 05/14/2022 negative Family history-  Colon cancer: no  Breast cancer: no  Ovarian cancer: no Menses: Irregular given that she has a Nexplanon in place, follows with GYN for this Vaccines-   Flu: Through work in September  Tetanus: Reports done through work previously  COVID19: X 2 HIV screening: Through blood donation Hep C Screening: Through blood donation Tobacco use: no Alcohol use: no Illicit Drug use: no Dentist: yes Ophthalmology: yes though is due to see   Active Ambulatory Problems    Diagnosis Date Noted   Routine general medical examination at a health care facility 06/11/2015   Migraine 06/11/2015   Obesity (BMI 30.0-34.9) 06/14/2017   History of anemia 06/14/2017   GERD (gastroesophageal reflux disease) 05/11/2019   Irregular menses 05/11/2019   Status post open reduction with internal fixation (ORIF) of fracture of ankle 04/06/2020   Ankle fracture, right 04/06/2020   Herniation of orbital fat 11/28/2020   Benign neoplasm of orbit, 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   Fracture of ankle, trimalleolar, right, closed 04/06/2020   PONV (postoperative nausea and vomiting) 05/31/2021   Resolved Ambulatory Problems    Diagnosis Date Noted   Overweight (BMI 25.0-29.9) 11/21/2016   Past Medical History:  Diagnosis Date   Amenorrhea    Cholelithiasis    Ovarian cyst     Family History  Problem Relation Age of Onset   Prostate cancer Father    Heart disease Father    Hyperlipidemia Mother    Heart disease Mother    Hypertension Mother    Stroke Paternal Grandmother    Breast cancer Neg Hx      Social History   Socioeconomic History   Marital status: Married    Spouse name: Not on file   Number of children: Not on file   Years of education: Not on file   Highest education level: Not on file  Occupational History   Not on file  Tobacco Use   Smoking status: Never   Smokeless tobacco: Never  Vaping Use   Vaping status: Never Used  Substance and Sexual Activity   Alcohol use: No    Alcohol/week: 0.0 standard drinks of alcohol   Drug use: No   Sexual activity: Yes    Birth control/protection: None  Other Topics Concern   Not on file  Social History Narrative   Not on file   Social Determinants of Health   Financial Resource Strain: Not on file  Food Insecurity: Not on file  Transportation Needs: Not on file  Physical Activity: Not on file  Stress: Not on file  Social Connections: Not on file  Intimate Partner Violence: Not on file    ROS  General:  Negative for nexplained weight loss, fever Skin: Negative for new or changing mole, sore that won't heal HEENT: Negative for trouble hearing, trouble seeing, ringing in ears, mouth sores, hoarseness, change in voice, dysphagia. CV:  Negative for chest pain, dyspnea, edema, palpitations Resp: Negative for cough, dyspnea, hemoptysis GI: Negative for nausea, vomiting, diarrhea, constipation, abdominal pain, melena, hematochezia. GU: Negative for dysuria, incontinence, urinary hesitance, hematuria, vaginal or penile  discharge, polyuria, sexual difficulty, lumps in testicle or breasts MSK: Negative for muscle cramps or aches, joint pain or swelling Neuro: Negative for headaches, weakness, numbness, dizziness, passing out/fainting Psych: Negative for depression, anxiety, memory problems  Objective  Physical Exam Vitals:   12/01/22 0827  BP: 126/80  Pulse: 94  Temp: 98.2 F (36.8 C)  SpO2: 97%    BP Readings from Last 3 Encounters:  12/01/22 126/80  11/29/21 120/70  02/22/21 135/90   Wt Readings from  Last 3 Encounters:  12/01/22 218 lb 11.2 oz (99.2 kg)  11/29/21 198 lb (89.8 kg)  02/22/21 204 lb (92.5 kg)    Physical Exam Constitutional:      General: She is not in acute distress.    Appearance: She is not diaphoretic.  HENT:     Head: Normocephalic and atraumatic.  Cardiovascular:     Rate and Rhythm: Normal rate and regular rhythm.     Heart sounds: Normal heart sounds.  Pulmonary:     Effort: Pulmonary effort is normal.     Breath sounds: Normal breath sounds.  Abdominal:     General: Bowel sounds are normal. There is no distension.     Palpations: Abdomen is soft.     Tenderness: There is no abdominal tenderness.  Musculoskeletal:     Right lower leg: No edema.     Left lower leg: No edema.  Lymphadenopathy:     Cervical: No cervical adenopathy.  Skin:    General: Skin is warm and dry.  Neurological:     Mental Status: She is alert.  Psychiatric:        Mood and Affect: Mood normal.      Assessment/Plan:   Routine general medical examination at a health care facility Assessment & Plan: Physical exam completed.  Encouraged reduction in fried foods and sweet intake.  Also discussed reducing soda intake.  Discussed increasing exercise to 3 times a week.  Cancer screening is up-to-date.  I encouraged her to confirm the date of her last tetanus vaccine.  She defers further COVID vaccinations.  Lab work as outlined.   Obesity (BMI 30.0-34.9) -     Hemoglobin A1c  History of anemia -     CBC -     IBC + Ferritin  Lipid screening -     Comprehensive metabolic panel -     Lipid panel  Other orders -     Venlafaxine HCl ER; Take 2 capsules (75 mg total) by mouth daily with breakfast.  Dispense: 180 capsule; Refill: 1    Return in about 1 year (around 12/01/2023) for physical with new provider.   Marikay Alar, MD [facility]

## 2022-12-01 NOTE — Assessment & Plan Note (Signed)
Physical exam completed.  Encouraged reduction in fried foods and sweet intake.  Also discussed reducing soda intake.  Discussed increasing exercise to 3 times a week.  Cancer screening is up-to-date.  I encouraged her to confirm the date of her last tetanus vaccine.  She defers further COVID vaccinations.  Lab work as outlined.

## 2022-12-08 ENCOUNTER — Telehealth: Payer: Self-pay

## 2022-12-08 NOTE — Telephone Encounter (Signed)
-----   Message from Marikay Alar sent at 12/08/2022 10:33 AM EST ----- Please relay the MyChart result message to the patient.  Thanks.

## 2022-12-08 NOTE — Telephone Encounter (Signed)
Left message for the Patient to call the office back regarding the message from Dr. Birdie Sons.

## 2022-12-13 ENCOUNTER — Other Ambulatory Visit: Payer: Self-pay

## 2022-12-15 ENCOUNTER — Other Ambulatory Visit: Payer: Self-pay

## 2023-01-18 ENCOUNTER — Other Ambulatory Visit: Payer: Self-pay | Admitting: Family Medicine

## 2023-01-19 ENCOUNTER — Other Ambulatory Visit: Payer: Self-pay

## 2023-01-19 MED ORDER — OMEPRAZOLE 40 MG PO CPDR
DELAYED_RELEASE_CAPSULE | Freq: Every day | ORAL | 1 refills | Status: DC
  Filled 2023-01-19: qty 90, 90d supply, fill #0
  Filled 2023-04-22: qty 90, 90d supply, fill #1

## 2023-01-23 ENCOUNTER — Other Ambulatory Visit: Payer: Self-pay

## 2023-01-23 DIAGNOSIS — J189 Pneumonia, unspecified organism: Secondary | ICD-10-CM | POA: Diagnosis not present

## 2023-01-23 DIAGNOSIS — R059 Cough, unspecified: Secondary | ICD-10-CM | POA: Diagnosis not present

## 2023-01-23 MED ORDER — ALBUTEROL SULFATE HFA 108 (90 BASE) MCG/ACT IN AERS
2.0000 | INHALATION_SPRAY | Freq: Four times a day (QID) | RESPIRATORY_TRACT | 0 refills | Status: DC | PRN
  Filled 2023-01-23: qty 6.7, 25d supply, fill #0

## 2023-01-23 MED ORDER — AMOXICILLIN-POT CLAVULANATE 875-125 MG PO TABS
1.0000 | ORAL_TABLET | Freq: Two times a day (BID) | ORAL | 0 refills | Status: DC
  Filled 2023-01-23: qty 20, 10d supply, fill #0

## 2023-01-23 MED ORDER — BENZONATATE 200 MG PO CAPS
200.0000 mg | ORAL_CAPSULE | Freq: Three times a day (TID) | ORAL | 0 refills | Status: DC | PRN
  Filled 2023-01-23: qty 20, 7d supply, fill #0

## 2023-01-23 MED ORDER — AZITHROMYCIN 250 MG PO TABS
ORAL_TABLET | ORAL | 0 refills | Status: AC
  Filled 2023-01-23: qty 6, 5d supply, fill #0

## 2023-01-23 MED ORDER — PROMETHAZINE-DM 6.25-15 MG/5ML PO SYRP
5.0000 mL | ORAL_SOLUTION | Freq: Three times a day (TID) | ORAL | 0 refills | Status: DC | PRN
  Filled 2023-01-23: qty 118, 8d supply, fill #0

## 2023-03-17 ENCOUNTER — Other Ambulatory Visit: Payer: Self-pay | Admitting: Family Medicine

## 2023-03-18 ENCOUNTER — Other Ambulatory Visit: Payer: Self-pay

## 2023-03-18 MED ORDER — SUMATRIPTAN SUCCINATE 100 MG PO TABS
ORAL_TABLET | ORAL | 0 refills | Status: AC
  Filled 2023-03-18: qty 9, 30d supply, fill #0

## 2023-06-11 ENCOUNTER — Other Ambulatory Visit: Payer: Self-pay | Admitting: Internal Medicine

## 2023-06-11 ENCOUNTER — Other Ambulatory Visit: Payer: Self-pay

## 2023-06-12 ENCOUNTER — Other Ambulatory Visit: Payer: Self-pay

## 2023-06-12 MED FILL — Venlafaxine HCl Cap ER 24HR 37.5 MG (Base Equivalent): ORAL | 90 days supply | Qty: 180 | Fill #0 | Status: AC

## 2023-06-15 ENCOUNTER — Other Ambulatory Visit: Payer: Self-pay

## 2023-07-19 ENCOUNTER — Other Ambulatory Visit: Payer: Self-pay

## 2023-07-20 ENCOUNTER — Other Ambulatory Visit: Payer: Self-pay

## 2023-07-20 ENCOUNTER — Other Ambulatory Visit: Payer: Self-pay | Admitting: Internal Medicine

## 2023-07-20 MED FILL — Omeprazole Cap Delayed Release 40 MG: ORAL | 90 days supply | Qty: 90 | Fill #0 | Status: AC

## 2023-09-10 MED FILL — Venlafaxine HCl Cap ER 24HR 37.5 MG (Base Equivalent): ORAL | 90 days supply | Qty: 180 | Fill #1 | Status: AC

## 2023-10-19 MED FILL — Omeprazole Cap Delayed Release 40 MG: ORAL | 90 days supply | Qty: 90 | Fill #1 | Status: AC

## 2023-11-08 ENCOUNTER — Other Ambulatory Visit: Payer: Self-pay

## 2023-11-09 ENCOUNTER — Encounter

## 2023-11-09 DIAGNOSIS — E66811 Obesity, class 1: Secondary | ICD-10-CM

## 2023-11-09 DIAGNOSIS — Z13220 Encounter for screening for lipoid disorders: Secondary | ICD-10-CM

## 2023-11-09 DIAGNOSIS — Z862 Personal history of diseases of the blood and blood-forming organs and certain disorders involving the immune mechanism: Secondary | ICD-10-CM

## 2023-11-10 ENCOUNTER — Other Ambulatory Visit: Payer: Self-pay

## 2023-11-10 ENCOUNTER — Telehealth: Payer: Self-pay

## 2023-11-10 MED ORDER — OMEPRAZOLE 40 MG PO CPDR
40.0000 mg | DELAYED_RELEASE_CAPSULE | Freq: Every day | ORAL | 1 refills | Status: AC
  Filled 2023-11-10: qty 90, fill #0
  Filled 2023-12-10 – 2024-01-17 (×2): qty 90, 90d supply, fill #0

## 2023-11-10 MED ORDER — VENLAFAXINE HCL ER 37.5 MG PO CP24
75.0000 mg | ORAL_CAPSULE | Freq: Every day | ORAL | 1 refills | Status: AC
  Filled 2023-11-10 – 2023-12-10 (×2): qty 180, 90d supply, fill #0

## 2023-11-10 NOTE — Telephone Encounter (Signed)
 Copied from CRM #[id number]. Topic: Clinical - Medication Question >> Nov 10, 2023  2:34 PM Thersia BROCKS wrote: Reason for CRM: Patient called in regarding [facility] appointment, patient missed her appointment yesterday so was calling in to rescheduled , next appointment isnt until 2026, patient did scheduled but wanted to know what she needs to do about her medication refills until then, would like a callback regarding this

## 2023-12-10 ENCOUNTER — Other Ambulatory Visit: Payer: Self-pay

## 2023-12-11 ENCOUNTER — Other Ambulatory Visit: Payer: Self-pay

## 2024-01-17 ENCOUNTER — Other Ambulatory Visit: Payer: Self-pay

## 2024-03-11 ENCOUNTER — Encounter: Admitting: Nurse Practitioner
